# Patient Record
Sex: Female | Born: 1990 | Race: Black or African American | Hispanic: No | Marital: Single | Smoking: Former smoker
Health system: Southern US, Community
[De-identification: ages and names within clinical notes are randomized; demographics above are authoritative.]

## PROBLEM LIST (undated history)

## (undated) DIAGNOSIS — I10 Essential (primary) hypertension: Secondary | ICD-10-CM

---
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
## 2009-03-08 ENCOUNTER — Ambulatory Visit: Payer: Self-pay | Admitting: Women's Health

## 2009-09-20 ENCOUNTER — Emergency Department (HOSPITAL_BASED_OUTPATIENT_CLINIC_OR_DEPARTMENT_OTHER): Admission: EM | Admit: 2009-09-20 | Discharge: 2009-09-20 | Payer: Self-pay | Admitting: Emergency Medicine

## 2010-05-19 ENCOUNTER — Ambulatory Visit: Payer: Self-pay | Admitting: Women's Health

## 2011-07-17 ENCOUNTER — Encounter: Payer: Self-pay | Admitting: Women's Health

## 2011-07-17 ENCOUNTER — Other Ambulatory Visit (HOSPITAL_COMMUNITY)
Admission: RE | Admit: 2011-07-17 | Discharge: 2011-07-17 | Disposition: A | Discharge status: Home / Self Care | Payer: 59 | Source: Ambulatory Visit | Attending: Obstetrics and Gynecology | Admitting: Obstetrics and Gynecology

## 2011-07-17 ENCOUNTER — Ambulatory Visit (INDEPENDENT_AMBULATORY_CARE_PROVIDER_SITE_OTHER): Payer: 59 | Admitting: Women's Health

## 2011-07-17 VITALS — BP 126/80 | Ht 70.25 in | Wt 267.0 lb

## 2011-07-17 DIAGNOSIS — Z113 Encounter for screening for infections with a predominantly sexual mode of transmission: Secondary | ICD-10-CM

## 2011-07-17 DIAGNOSIS — IMO0001 Reserved for inherently not codable concepts without codable children: Secondary | ICD-10-CM

## 2011-07-17 DIAGNOSIS — Z833 Family history of diabetes mellitus: Secondary | ICD-10-CM

## 2011-07-17 DIAGNOSIS — Z309 Encounter for contraceptive management, unspecified: Secondary | ICD-10-CM

## 2011-07-17 DIAGNOSIS — Z23 Encounter for immunization: Secondary | ICD-10-CM

## 2011-07-17 DIAGNOSIS — Z01419 Encounter for gynecological examination (general) (routine) without abnormal findings: Secondary | ICD-10-CM

## 2011-07-17 DIAGNOSIS — L089 Local infection of the skin and subcutaneous tissue, unspecified: Secondary | ICD-10-CM

## 2011-07-17 LAB — CBC WITH DIFFERENTIAL/PLATELET
Eosinophils Absolute: 0.2 10*3/uL (ref 0.0–0.7)
Eosinophils Relative: 2 % (ref 0–5)
HCT: 40.5 % (ref 36.0–46.0)
Hemoglobin: 13 g/dL (ref 12.0–15.0)
Lymphs Abs: 3.3 10*3/uL (ref 0.7–4.0)
MCH: 27.3 pg (ref 26.0–34.0)
MCHC: 32.1 g/dL (ref 30.0–36.0)
MCV: 84.9 fL (ref 78.0–100.0)
Monocytes Absolute: 0.6 10*3/uL (ref 0.1–1.0)
Monocytes Relative: 8 % (ref 3–12)
Neutrophils Relative %: 43 % (ref 43–77)
RBC: 4.77 MIL/uL (ref 3.87–5.11)

## 2011-07-17 LAB — GLUCOSE, RANDOM: Glucose, Bld: 78 mg/dL (ref 70–99)

## 2011-07-17 LAB — URINALYSIS W MICROSCOPIC + REFLEX CULTURE
Glucose, UA: NEGATIVE mg/dL
Ketones, ur: NEGATIVE mg/dL
Nitrite: NEGATIVE
Specific Gravity, Urine: 1.015 (ref 1.005–1.030)
pH: 7 (ref 5.0–8.0)

## 2011-07-17 MED ORDER — CLINDAMYCIN PHOSPHATE 1 % EX GEL: Freq: Two times a day (BID) | CUTANEOUS | Status: AC

## 2011-07-17 MED ORDER — NORGESTIMATE-ETH ESTRADIOL 0.25-35 MG-MCG PO TABS: 1.0000 | ORAL_TABLET | Freq: Every day | ORAL | Status: DC

## 2011-07-17 NOTE — Progress Notes (Signed)
Jodi Castillo 10/18/1990 [id number]    History:    The patient presents for annual exam.  Monthly 5 days cycle on Sprintec. History of irregular cycles prior to Sprintec. New partner. 1 gardasil in December 2011. Complaint of boils on axilla, groin and inner thigh area.   Past medical history, past surgical history, family history and social history were all reviewed and documented in the EPIC chart.   ROS:  A  ROS was performed and pertinent positives and negatives are included in the history.  Exam:  Filed Vitals:   07/17/11 1136  BP: 126/80    General appearance:  Normal Head/Neck:  Normal, without cervical or supraclavicular adenopathy. Thyroid:  Symmetrical, normal in size, without palpable masses or nodularity. Respiratory  Effort:  Normal  Auscultation:  Clear without wheezing or rhonchi Cardiovascular  Auscultation:  Regular rate, without rubs, murmurs or gallops  Edema/varicosities:  Not grossly evident Abdominal  Soft,nontender, without masses, guarding or rebound.  Liver/spleen:  No organomegaly noted  Hernia:  None appreciated  Skin  Inspection:  Grossly normal  Palpation:  Grossly normal Neurologic/psychiatric  Orientation:  Normal with appropriate conversation.  Mood/affect:  Normal  Genitourinary    Breasts: Examined lying and sitting/pendulous.     Right: Without masses, retractions, discharge or axillary adenopathy.     Left: Without masses, retractions, discharge or axillary adenopathy.   Inguinal/mons:  Normal without inguinal adenopathy  External genitalia:  Small healing follicular infections  BUS/Urethra/Skene's glands:  Normal  Bladder:  Normal  Vagina:  Normal  Cervix:  Normal  Uterus:   normal in size, shape and contour.  Midline and mobile  Adnexa/parametria:     Rt: Without masses or tenderness.   Lt: Without masses or tenderness.  Anus and perineum: Normal  Digital rectal exam: Normal sphincter tone without palpated masses or  tenderness  Assessment/Plan:  21 y.o. SBFG0 for annual exam.   STD screening Morbid obesity Recurrent mild folliculitis Sprintec with regular cycle  Plan: Sprintec prescription, proper use, slight risk for blood clots and strokes reviewed. Encouraged condoms until permanent partner. SBE's, increasing exercise, decreasing calories for weight loss. Discussed importance of weight loss in relationship to health. Clindamycin gel to follicular irritations, prevention discussed call if no relief. CBC, glucose, UA and Pap,GC/Chlamydia, HIV, hepatitis C, and  RPR. Restart gardasil series today reviewed importance of returning to the office 2 and 6 months to complete the series.  Harrington Challenger [facility], 12:13 PM 07/17/2011

## 2011-07-17 NOTE — Patient Instructions (Signed)
Return to office for gardasil in 2 and 6 months  You can do it!!!!  Weight watchers for a healthier YOU!  Walk most days of the week for atleast 20-30 min

## 2011-07-18 LAB — HEPATITIS C ANTIBODY: HCV Ab: NEGATIVE

## 2011-07-18 LAB — HIV ANTIBODY (ROUTINE TESTING W REFLEX): HIV: NONREACTIVE

## 2011-07-18 LAB — GC/CHLAMYDIA PROBE AMP, GENITAL: GC Probe Amp, Genital: NEGATIVE

## 2011-07-19 ENCOUNTER — Other Ambulatory Visit: Payer: Self-pay | Admitting: Women's Health

## 2011-07-19 DIAGNOSIS — B999 Unspecified infectious disease: Secondary | ICD-10-CM

## 2011-07-19 MED ORDER — AZITHROMYCIN 1 G PO PACK: 1.0000 | PACK | Freq: Once | ORAL | Status: AC

## 2011-08-17 ENCOUNTER — Other Ambulatory Visit: Payer: Self-pay | Admitting: Women's Health

## 2011-08-17 DIAGNOSIS — IMO0001 Reserved for inherently not codable concepts without codable children: Secondary | ICD-10-CM

## 2011-08-17 MED ORDER — NORGESTIMATE-ETH ESTRADIOL 0.25-35 MG-MCG PO TABS: 1.0000 | ORAL_TABLET | Freq: Every day | ORAL | Status: DC

## 2011-09-27 ENCOUNTER — Ambulatory Visit (INDEPENDENT_AMBULATORY_CARE_PROVIDER_SITE_OTHER): Payer: 59 | Admitting: Anesthesiology

## 2011-09-27 DIAGNOSIS — Z23 Encounter for immunization: Secondary | ICD-10-CM

## 2011-12-14 ENCOUNTER — Encounter: Payer: Self-pay | Admitting: *Deleted

## 2011-12-14 NOTE — Progress Notes (Signed)
Patient ID: Jodi Castillo, female   DOB: [birth year], 21 y.o.   MRN: [id number] Reported + Chlamydia to HD

## 2011-12-14 NOTE — Telephone Encounter (Signed)
Error

## 2012-09-12 ENCOUNTER — Other Ambulatory Visit: Payer: Self-pay | Admitting: Women's Health

## 2012-09-12 NOTE — Telephone Encounter (Signed)
Will ask appt desk to call her to schedule CE.

## 2012-09-27 ENCOUNTER — Encounter: Payer: Self-pay | Admitting: Women's Health

## 2012-09-27 ENCOUNTER — Ambulatory Visit (INDEPENDENT_AMBULATORY_CARE_PROVIDER_SITE_OTHER): Payer: 59 | Admitting: Women's Health

## 2012-09-27 VITALS — BP 130/74 | Ht 70.0 in | Wt 265.0 lb

## 2012-09-27 DIAGNOSIS — Z309 Encounter for contraceptive management, unspecified: Secondary | ICD-10-CM

## 2012-09-27 DIAGNOSIS — Z23 Encounter for immunization: Secondary | ICD-10-CM

## 2012-09-27 DIAGNOSIS — Z833 Family history of diabetes mellitus: Secondary | ICD-10-CM

## 2012-09-27 DIAGNOSIS — Z01419 Encounter for gynecological examination (general) (routine) without abnormal findings: Secondary | ICD-10-CM

## 2012-09-27 DIAGNOSIS — Z113 Encounter for screening for infections with a predominantly sexual mode of transmission: Secondary | ICD-10-CM

## 2012-09-27 DIAGNOSIS — IMO0001 Reserved for inherently not codable concepts without codable children: Secondary | ICD-10-CM

## 2012-09-27 LAB — CBC WITH DIFFERENTIAL/PLATELET
Basophils Relative: 0 % (ref 0–1)
Eosinophils Absolute: 0.2 10*3/uL (ref 0.0–0.7)
Eosinophils Relative: 2 % (ref 0–5)
Hemoglobin: 12.5 g/dL (ref 12.0–15.0)
MCH: 27.2 pg (ref 26.0–34.0)
MCHC: 33.8 g/dL (ref 30.0–36.0)
MCV: 80.6 fL (ref 78.0–100.0)
Monocytes Absolute: 0.8 10*3/uL (ref 0.1–1.0)
Monocytes Relative: 8 % (ref 3–12)
Neutrophils Relative %: 55 % (ref 43–77)

## 2012-09-27 MED ORDER — NORGESTIMATE-ETH ESTRADIOL 0.25-35 MG-MCG PO TABS: ORAL_TABLET | ORAL | Status: DC

## 2012-09-27 NOTE — Patient Instructions (Signed)

## 2012-09-27 NOTE — Addendum Note (Signed)
Addended by: Richardson Chiquito on: 09/27/2012 04:22 PM   Modules accepted: Orders

## 2012-09-27 NOTE — Progress Notes (Signed)
Jodi Castillo [birth year] [id number]    History:    The patient presents for annual exam with no complaints. Regular monthly cycle on Orhtho-cyclen  Chlamydia 10 months ago and has not been sexually active since. No test of cure. 2 gardasil given in 2013. Normal pap history (2013). Shoulder, upper back, and neck discomfort from pendulous breasts.   Past medical history, past surgical history, family history and social history were all reviewed and documented in the EPIC chart. Works full time at [employer] [place]. Lives with sister. Stomach ulcer (0ct 13), nausea improved with omeprazole. Mother, MGM, and maternal aunt- HTN.   ROS:  A  ROS was performed and pertinent positives and negatives are included in the history. .  Exam:  Filed Vitals:   09/27/12 1409  BP: 130/74    General appearance:  Normal Head/Neck:  Normal, without cervical or supraclavicular adenopathy. Thyroid:  Symmetrical, normal in size, without palpable masses or nodularity. Respiratory  Effort:  Normal  Auscultation:  Clear without wheezing or rhonchi Cardiovascular  Auscultation:  Regular rate, without rubs, murmurs or gallops  Edema/varicosities:  Not grossly evident Abdominal  Soft,nontender, without masses, guarding or rebound.  Liver/spleen:  No organomegaly noted  Hernia:  None appreciated  Skin  Inspection:  Grossly normal  Palpation:  Grossly normal Neurologic/psychiatric  Orientation:  Normal with appropriate conversation.  Mood/affect:  Normal  Genitourinary    Breasts: Examined lying and sittingpendulous.     Right: Without masses, retractions, discharge or axillary adenopathy.     Left: Without masses, retractions, discharge or axillary adenopathy.   Inguinal/mons:  Normal without inguinal adenopathy  External genitalia:  Normal  BUS/Urethra/Skene's glands:  Normal  Bladder:  Normal  Vagina:  Normal  Cervix:  Normal  Uterus:  normal in size, shape and contour.  Midline and  mobile  Adnexa/parametria:     Rt: Without masses or tenderness.   Lt: Without masses or tenderness.  Anus and perineum: Normal    Assessment/Plan:  22 y.o. SBF G0  for annual exam with no complaints.  Normal GYN exam Obesity   Plan: CBC, glucose, UA, GC/Chlamydia - TOC.  3rd gardasil given. Ortho-cyclen 0.25-0.35 mg. Prescription, proper use, risk for blood clots and strokes reviewed, condoms encouraged when sexually active. SBE's, calcium rich diet, multivitamin. Encouraged healthy diet and exercise for weight loss and health.   Harrington Challenger [facility], 2:40 PM 09/27/2012

## 2012-09-28 LAB — GC/CHLAMYDIA PROBE AMP: CT Probe RNA: NEGATIVE

## 2012-09-28 LAB — URINALYSIS W MICROSCOPIC + REFLEX CULTURE
Bacteria, UA: NONE SEEN
Bilirubin Urine: NEGATIVE
Casts: NONE SEEN
Crystals: NONE SEEN
Glucose, UA: NEGATIVE mg/dL
Hgb urine dipstick: NEGATIVE
Ketones, ur: NEGATIVE mg/dL
Specific Gravity, Urine: 1.024 (ref 1.005–1.030)
pH: 6.5 (ref 5.0–8.0)

## 2012-11-15 ENCOUNTER — Ambulatory Visit (INDEPENDENT_AMBULATORY_CARE_PROVIDER_SITE_OTHER): Payer: 59 | Admitting: Women's Health

## 2012-11-15 ENCOUNTER — Encounter: Payer: Self-pay | Admitting: Women's Health

## 2012-11-15 DIAGNOSIS — B9689 Other specified bacterial agents as the cause of diseases classified elsewhere: Secondary | ICD-10-CM

## 2012-11-15 DIAGNOSIS — Z309 Encounter for contraceptive management, unspecified: Secondary | ICD-10-CM

## 2012-11-15 DIAGNOSIS — L293 Anogenital pruritus, unspecified: Secondary | ICD-10-CM

## 2012-11-15 DIAGNOSIS — N898 Other specified noninflammatory disorders of vagina: Secondary | ICD-10-CM

## 2012-11-15 DIAGNOSIS — IMO0001 Reserved for inherently not codable concepts without codable children: Secondary | ICD-10-CM

## 2012-11-15 DIAGNOSIS — A499 Bacterial infection, unspecified: Secondary | ICD-10-CM

## 2012-11-15 DIAGNOSIS — N760 Acute vaginitis: Secondary | ICD-10-CM

## 2012-11-15 LAB — WET PREP FOR TRICH, YEAST, CLUE: Yeast Wet Prep HPF POC: NONE SEEN

## 2012-11-15 MED ORDER — FLUCONAZOLE 150 MG PO TABS: 150.0000 mg | ORAL_TABLET | Freq: Once | ORAL | Status: DC

## 2012-11-15 MED ORDER — METRONIDAZOLE 0.75 % VA GEL: VAGINAL | Status: DC

## 2012-11-15 MED ORDER — NORGESTIMATE-ETH ESTRADIOL 0.25-35 MG-MCG PO TABS: ORAL_TABLET | ORAL | Status: DC

## 2012-11-15 NOTE — Patient Instructions (Addendum)
Bacterial Vaginosis Bacterial vaginosis (BV) is a vaginal infection where the normal balance of bacteria in the vagina is disrupted. The normal balance is then replaced by an overgrowth of certain bacteria. There are several different kinds of bacteria that can cause BV. BV is the most common vaginal infection in women of childbearing age. CAUSES   The cause of BV is not fully understood. BV develops when there is an increase or imbalance of harmful bacteria.  Some activities or behaviors can upset the normal balance of bacteria in the vagina and put women at increased risk including:  Having a new sex partner or multiple sex partners.  Douching.  Using an intrauterine device (IUD) for contraception.  It is not clear what role sexual activity plays in the development of BV. However, women that have never had sexual intercourse are rarely infected with BV. Women do not get BV from toilet seats, bedding, swimming pools or from touching objects around them.  SYMPTOMS   Grey vaginal discharge.  A fish-like odor with discharge, especially after sexual intercourse.  Itching or burning of the vagina and vulva.  Burning or pain with urination.  Some women have no signs or symptoms at all. DIAGNOSIS  Your caregiver must examine the vagina for signs of BV. Your caregiver will perform lab tests and look at the sample of vaginal fluid through a microscope. They will look for bacteria and abnormal cells (clue cells), a pH test higher than 4.5, and a positive amine test all associated with BV.  RISKS AND COMPLICATIONS   Pelvic inflammatory disease (PID).  Infections following gynecology surgery.  Developing HIV.  Developing herpes virus. TREATMENT  Sometimes BV will clear up without treatment. However, all women with symptoms of BV should be treated to avoid complications, especially if gynecology surgery is planned. Female partners generally do not need to be treated. However, BV may spread  between female sex partners so treatment is helpful in preventing a recurrence of BV.   BV may be treated with antibiotics. The antibiotics come in either pill or vaginal cream forms. Either can be used with nonpregnant or pregnant women, but the recommended dosages differ. These antibiotics are not harmful to the baby.  BV can recur after treatment. If this happens, a second round of antibiotics will often be prescribed.  Treatment is important for pregnant women. If not treated, BV can cause a premature delivery, especially for a pregnant woman who had a premature birth in the past. All pregnant women who have symptoms of BV should be checked and treated.  For chronic reoccurrence of BV, treatment with a type of prescribed gel vaginally twice a week is helpful. HOME CARE INSTRUCTIONS   Finish all medication as directed by your caregiver.  Do not have sex until treatment is completed.  Tell your sexual partner that you have a vaginal infection. They should see their caregiver and be treated if they have problems, such as a mild rash or itching.  Practice safe sex. Use condoms. Only have 1 sex partner. PREVENTION  Basic prevention steps can help reduce the risk of upsetting the natural balance of bacteria in the vagina and developing BV:  Do not have sexual intercourse (be abstinent).  Do not douche.  Use all of the medicine prescribed for treatment of BV, even if the signs and symptoms go away.  Tell your sex partner if you have BV. That way, they can be treated, if needed, to prevent reoccurrence. SEEK MEDICAL CARE IF:     Your symptoms are not improving after 3 days of treatment.  You have increased discharge, pain, or fever. MAKE SURE YOU:   Understand these instructions.  Will watch your condition.  Will get help right away if you are not doing well or get worse. FOR MORE INFORMATION  Division of STD Prevention (DSTDP), Centers for Disease Control and Prevention:  www.cdc.gov/std American Social Health Association (ASHA): www.ashastd.org  Document Released: 05/15/2005 Document Revised: 08/07/2011 Document Reviewed: 11/05/2008 ExitCare Patient Information 2014 [employer].  

## 2012-11-15 NOTE — Progress Notes (Signed)
Patient ID: Jodi Castillo, female   DOB: March 24, 1991, 22 y.o.   MRN: [id number] Presents with complaint of vaginal discharge with itching. Monthly cycle on Ortho-Cyclen. Intercourse x1 with new partner condom broke. Denies any urinary symptoms, abdominal pain or fever.  Exam: Appears well. External genitalia erythematous at introitus, speculum exam moderate amount of milky discharge noted, wet prep positive for amines, clues, TNTC bacteria. GC/Chlamydia culture taken. Bimanual no CMT limited exam due to obesity.  Bacteria vaginosis  Plan: MetroGel vaginal cream 1 applicator at bedtime x5, alcohol precautions reviewed. Diflucan 150 times one dose if needed for or itching. GC/Chlamydia culture pending, will check HIV, hepatitis and RPR at annual exam.

## 2013-10-06 ENCOUNTER — Other Ambulatory Visit: Payer: Self-pay

## 2013-10-06 DIAGNOSIS — IMO0001 Reserved for inherently not codable concepts without codable children: Secondary | ICD-10-CM

## 2013-10-06 MED ORDER — NORGESTIMATE-ETH ESTRADIOL 0.25-35 MG-MCG PO TABS: ORAL_TABLET | ORAL | Status: DC

## 2013-10-06 NOTE — Telephone Encounter (Signed)
Patient states she is needing to schedule CE but needs OC refilled.  She has started new job and cannot come right away. Only just now due.  I refilled OC's for the three packs she gets at a time. She will talk with her manager and she understands she needs CE before these three packs run out and she promised me she would get scheduled soon.

## 2013-10-23 ENCOUNTER — Encounter: Payer: Self-pay | Admitting: Women's Health

## 2013-10-23 ENCOUNTER — Ambulatory Visit (INDEPENDENT_AMBULATORY_CARE_PROVIDER_SITE_OTHER): Payer: BC Managed Care – PPO | Admitting: Women's Health

## 2013-10-23 VITALS — BP 128/84 | Ht 70.0 in | Wt 273.0 lb

## 2013-10-23 DIAGNOSIS — Z01419 Encounter for gynecological examination (general) (routine) without abnormal findings: Secondary | ICD-10-CM

## 2013-10-23 DIAGNOSIS — N760 Acute vaginitis: Secondary | ICD-10-CM

## 2013-10-23 DIAGNOSIS — Z309 Encounter for contraceptive management, unspecified: Secondary | ICD-10-CM

## 2013-10-23 DIAGNOSIS — A499 Bacterial infection, unspecified: Secondary | ICD-10-CM

## 2013-10-23 DIAGNOSIS — Z113 Encounter for screening for infections with a predominantly sexual mode of transmission: Secondary | ICD-10-CM

## 2013-10-23 DIAGNOSIS — N898 Other specified noninflammatory disorders of vagina: Secondary | ICD-10-CM

## 2013-10-23 DIAGNOSIS — B9689 Other specified bacterial agents as the cause of diseases classified elsewhere: Secondary | ICD-10-CM

## 2013-10-23 DIAGNOSIS — IMO0001 Reserved for inherently not codable concepts without codable children: Secondary | ICD-10-CM

## 2013-10-23 DIAGNOSIS — Z833 Family history of diabetes mellitus: Secondary | ICD-10-CM

## 2013-10-23 LAB — CBC WITH DIFFERENTIAL/PLATELET
BASOS PCT: 0 % (ref 0–1)
Basophils Absolute: 0 10*3/uL (ref 0.0–0.1)
EOS PCT: 1 % (ref 0–5)
Eosinophils Absolute: 0.1 10*3/uL (ref 0.0–0.7)
HEMATOCRIT: 37.7 % (ref 36.0–46.0)
HEMOGLOBIN: 12.6 g/dL (ref 12.0–15.0)
LYMPHS PCT: 35 % (ref 12–46)
Lymphs Abs: 3.3 10*3/uL (ref 0.7–4.0)
MCH: 26.5 pg (ref 26.0–34.0)
MCHC: 33.4 g/dL (ref 30.0–36.0)
MCV: 79.4 fL (ref 78.0–100.0)
MONO ABS: 0.6 10*3/uL (ref 0.1–1.0)
MONOS PCT: 6 % (ref 3–12)
NEUTROS ABS: 5.5 10*3/uL (ref 1.7–7.7)
Neutrophils Relative %: 58 % (ref 43–77)
Platelets: 403 10*3/uL — ABNORMAL HIGH (ref 150–400)
RBC: 4.75 MIL/uL (ref 3.87–5.11)
RDW: 13.7 % (ref 11.5–15.5)
WBC: 9.4 10*3/uL (ref 4.0–10.5)

## 2013-10-23 LAB — WET PREP FOR TRICH, YEAST, CLUE
Trich, Wet Prep: NONE SEEN
YEAST WET PREP: NONE SEEN

## 2013-10-23 LAB — GLUCOSE, RANDOM: Glucose, Bld: 83 mg/dL (ref 70–99)

## 2013-10-23 MED ORDER — NORGESTIMATE-ETH ESTRADIOL 0.25-35 MG-MCG PO TABS: ORAL_TABLET | ORAL | Status: DC

## 2013-10-23 MED ORDER — METRONIDAZOLE 0.75 % VA GEL: VAGINAL | Status: DC

## 2013-10-23 NOTE — Patient Instructions (Signed)

## 2013-10-23 NOTE — Progress Notes (Signed)
Jodi Castillo [birth year] [id number]    History:    Presents for annual exam.  Light monthly cycle on Ortho-Cyclen. New partner. Gardasil series completed. Normal Pap history. Complaint of vaginal irritation and upper back and shoulder pain from pendulous breasts. Positive Chlamydia in 2013 with negative test of cure  Past medical history, past surgical history, family history and social history were all reviewed and documented in the EPIC chart. Works at [employer]. Mother had a stroke this year.  ROS:  A  12 point ROS was performed and pertinent positives and negatives are included.  Exam:  Filed Vitals:   10/23/13 1402  BP: 128/84    General appearance:  Normal Thyroid:  Symmetrical, normal in size, without palpable masses or nodularity. Respiratory  Auscultation:  Clear without wheezing or rhonchi Cardiovascular  Auscultation:  Regular rate, without rubs, murmurs or gallops  Edema/varicosities:  Not grossly evident Abdominal  Soft,nontender, without masses, guarding or rebound.  Liver/spleen:  No organomegaly noted  Hernia:  None appreciated  Skin  Inspection:  Grossly normal   Breasts: Examined lying and sitting/pendulous.     Right: Without masses, retractions, discharge or axillary adenopathy.     Left: Without masses, retractions, discharge or axillary adenopathy. Gentitourinary   Inguinal/mons:  Normal without inguinal adenopathy  External genitalia:  Normal  BUS/Urethra/Skene's glands:  Normal  Vagina:  Wet prep positive for amines, clues, and TNTC bacteria  Cervix:  Normal  Uterus:   normal in size, shape and contour.  Midline and mobile  Adnexa/parametria:     Rt: Without masses or tenderness.   Lt: Without masses or tenderness.  Anus and perineum: Normal  Digital rectal exam: Normal sphincter tone without palpated masses or tenderness  Assessment/Plan:  23 y.o. SBF G0 for annual exam with vaginal irritation.  Bacteria vaginosis STD  screen Monthly cycle on Ortho-Cyclen Morbid obesity Pendulous breasts  Plan: Ortho-Cyclen prescription, proper use, slight risk for blood clots and strokes reviewed. Condoms encouraged until permanent partner. Reviewed importance of no smoking, decreasing calories for weight loss, increasing exercise, calcium rich diet, MVI daily and SBE's  encouraged. MetroGel vaginal cream 1 applicator at bedtime x5, prescription, proper use given alcohol precautions reviewed.  CBC, glucose, UA, GC/Chlamydia, HIV, hep B, C., RPR. Paps normal 2013, new screening guidelines reviewed. Reviewed scheduling an appointment with plastic surgeon to discuss breast reduction surgery.   Note: This dictation was prepared with Dragon/digital dictation.  Any transcriptional errors that result are unintentional. Harrington Challenger [facility], 3:46 PM 10/23/2013

## 2013-10-24 LAB — URINALYSIS W MICROSCOPIC + REFLEX CULTURE
BACTERIA UA: NONE SEEN
Bilirubin Urine: NEGATIVE
CASTS: NONE SEEN
Glucose, UA: NEGATIVE mg/dL
HGB URINE DIPSTICK: NEGATIVE
Leukocytes, UA: NEGATIVE
NITRITE: NEGATIVE
PROTEIN: NEGATIVE mg/dL
Specific Gravity, Urine: 1.026 (ref 1.005–1.030)
Squamous Epithelial / LPF: NONE SEEN
Urobilinogen, UA: 1 mg/dL (ref 0.0–1.0)
pH: 6 (ref 5.0–8.0)

## 2013-10-24 LAB — GC/CHLAMYDIA PROBE AMP
CT PROBE, AMP APTIMA: NEGATIVE
GC Probe RNA: NEGATIVE

## 2013-10-24 LAB — RPR

## 2013-10-24 LAB — HEPATITIS B SURFACE ANTIGEN: HEP B S AG: NEGATIVE

## 2013-10-24 LAB — HEPATITIS C ANTIBODY: HCV Ab: NEGATIVE

## 2013-10-24 LAB — HIV ANTIBODY (ROUTINE TESTING W REFLEX): HIV 1&2 Ab, 4th Generation: NONREACTIVE

## 2013-10-29 ENCOUNTER — Telehealth: Payer: Self-pay | Admitting: *Deleted

## 2013-10-29 MED ORDER — FLUCONAZOLE 150 MG PO TABS: ORAL_TABLET | ORAL | Status: DC

## 2013-10-29 NOTE — Telephone Encounter (Signed)
Pt was prescribed metrogel on 10/23/13 was also taking antibiotic for her mouth. Pt now has yeast infection requesting Rx for yeast. Please advise

## 2013-10-29 NOTE — Telephone Encounter (Signed)
Pt informed, rx sent 

## 2013-10-29 NOTE — Telephone Encounter (Signed)
Diflucan 150 today and repeat in 3 days if needed. #2

## 2013-12-12 ENCOUNTER — Telehealth: Payer: Self-pay | Admitting: *Deleted

## 2013-12-12 MED ORDER — FLUCONAZOLE 150 MG PO TABS: 150.0000 mg | ORAL_TABLET | Freq: Once | ORAL | Status: DC

## 2013-12-12 NOTE — Telephone Encounter (Signed)
Pt informed, rx sent 

## 2013-12-12 NOTE — Telephone Encounter (Signed)
Pt called c/o clumpy white discharge, took both Diflucan 150 #2 given on 10/29/13 telephone encounter. Pt said no itching, no odor, no problems, only the white discharge. She stopped taking the amoxacillin 1 week ago (was taking due dental work). Pt at work now [employer] to come for OV. Pt would like you recommendations. Please advise

## 2013-12-12 NOTE — Telephone Encounter (Signed)
Please call - Repeat diflucan 150 one more dose, should decrease the discharge. Probably antibiotic related.

## 2013-12-20 ENCOUNTER — Other Ambulatory Visit: Payer: Self-pay | Admitting: Women's Health

## 2014-01-01 ENCOUNTER — Telehealth: Payer: Self-pay | Admitting: *Deleted

## 2014-01-01 NOTE — Telephone Encounter (Signed)
Pt currently taking ortho cyclen having cylces monthly,takes on time daily. Pt said that her cycle came 1 week earlier on 1st pack has been bleeding 18 days now. Pt said bleeding would go from heavy to light, now heavy wearing tampons, changing every 3-4 hours. Please advise

## 2014-01-01 NOTE — Telephone Encounter (Signed)
Telephone call, states has had no missed pills, taking daily, same partner with negative screen. Double up today and tomorrow, if bleeding continues instructed to call. Instructed to take only 4 days placebo and start new pack of pills.

## 2014-01-01 NOTE — Telephone Encounter (Signed)
Patient called back to see if Harriett Sine "has come to a conclusion why I am having my menstrual cycle for 18 days."

## 2014-01-06 ENCOUNTER — Telehealth: Payer: Self-pay | Admitting: *Deleted

## 2014-01-06 NOTE — Telephone Encounter (Signed)
Telephone call to review problem. States has been bleeding for one month, minimal relief with doubling up pills, and shorter placebo week. Same partner neg STD screen. Office visit to check TSH and prolactin, if normal will proceed to sonohysterogram.

## 2014-01-06 NOTE — Telephone Encounter (Signed)
Pt called to follow up from telephone encounter 01/01/14 pt said she is still bleeding and done everything as directed as stating on that call. Please advise

## 2014-01-08 ENCOUNTER — Ambulatory Visit (INDEPENDENT_AMBULATORY_CARE_PROVIDER_SITE_OTHER): Payer: BC Managed Care – PPO | Admitting: Women's Health

## 2014-01-08 ENCOUNTER — Encounter: Payer: Self-pay | Admitting: Women's Health

## 2014-01-08 DIAGNOSIS — N925 Other specified irregular menstruation: Secondary | ICD-10-CM

## 2014-01-08 DIAGNOSIS — B9689 Other specified bacterial agents as the cause of diseases classified elsewhere: Secondary | ICD-10-CM

## 2014-01-08 DIAGNOSIS — A499 Bacterial infection, unspecified: Secondary | ICD-10-CM

## 2014-01-08 DIAGNOSIS — N949 Unspecified condition associated with female genital organs and menstrual cycle: Secondary | ICD-10-CM

## 2014-01-08 DIAGNOSIS — N760 Acute vaginitis: Secondary | ICD-10-CM

## 2014-01-08 DIAGNOSIS — N938 Other specified abnormal uterine and vaginal bleeding: Secondary | ICD-10-CM

## 2014-01-08 LAB — WET PREP FOR TRICH, YEAST, CLUE
Trich, Wet Prep: NONE SEEN
WBC, Wet Prep HPF POC: NONE SEEN
Yeast Wet Prep HPF POC: NONE SEEN

## 2014-01-08 LAB — TSH: TSH: 0.903 u[IU]/mL (ref 0.350–4.500)

## 2014-01-08 MED ORDER — METRONIDAZOLE 500 MG PO TABS: 500.0000 mg | ORAL_TABLET | Freq: Two times a day (BID) | ORAL | Status: DC

## 2014-01-08 MED ORDER — MEDROXYPROGESTERONE ACETATE 10 MG PO TABS: 10.0000 mg | ORAL_TABLET | Freq: Every day | ORAL | Status: DC

## 2014-01-08 NOTE — Patient Instructions (Signed)
Dysfunctional Uterine Bleeding Normally, menstrual periods begin between ages 11 to 17 in young women. A normal menstrual cycle/period may begin every 23 days up to 35 days and lasts from 1 to 7 days. Around 12 to 14 days before your menstrual period starts, ovulation (ovary produces an egg) occurs. When counting the time between menstrual periods, count from the first day of bleeding of the previous period to the first day of bleeding of the next period. Dysfunctional (abnormal) uterine bleeding is bleeding that is different from a normal menstrual period. Your periods may come earlier or later than usual. They may be lighter, have blood clots or be heavier. You may have bleeding between periods, or you may skip one period or more. You may have bleeding after sexual intercourse, bleeding after menopause, or no menstrual period. CAUSES   Pregnancy (normal, miscarriage, tubal).  IUDs (intrauterine device, birth control).  Birth control pills.  Hormone treatment.  Menopause.  Infection of the cervix.  Blood clotting problems.  Infection of the inside lining of the uterus.  Endometriosis, inside lining of the uterus growing in the pelvis and other female organs.  Adhesions (scar tissue) inside the uterus.  Obesity or severe weight loss.  Uterine polyps inside the uterus.  Cancer of the vagina, cervix, or uterus.  Ovarian cysts or polycystic ovary syndrome.  Medical problems (diabetes, thyroid disease).  Uterine fibroids (noncancerous tumor).  Problems with your female hormones.  Endometrial hyperplasia, very thick lining and enlarged cells inside of the uterus.  Medicines that interfere with ovulation.  Radiation to the pelvis or abdomen.  Chemotherapy. DIAGNOSIS   Your doctor will discuss the history of your menstrual periods, medicines you are taking, changes in your weight, stress in your life, and any medical problems you may have.  Your doctor will do a physical  and pelvic examination.  Your doctor may want to perform certain tests to make a diagnosis, such as:  Pap test.  Blood tests.  Cultures for infection.  CT scan.  Ultrasound.  Hysteroscopy.  Laparoscopy.  MRI.  Hysterosalpingography.  D and C.  Endometrial biopsy. TREATMENT  Treatment will depend on the cause of the dysfunctional uterine bleeding (DUB). Treatment may include:  Observing your menstrual periods for a couple of months.  Prescribing medicines for medical problems, including:  Antibiotics.  Hormones.  Birth control pills.  Removing an IUD (intrauterine device, birth control).  Surgery:  D and C (scrape and remove tissue from inside the uterus).  Laparoscopy (examine inside the abdomen with a lighted tube).  Uterine ablation (destroy lining of the uterus with electrical current, laser, heat, or freezing).  Hysteroscopy (examine cervix and uterus with a lighted tube).  Hysterectomy (remove the uterus). HOME CARE INSTRUCTIONS   If medicines were prescribed, take exactly as directed. Do not change or switch medicines without consulting your caregiver.  Long term heavy bleeding may result in iron deficiency. Your caregiver may have prescribed iron pills. They help replace the iron that your body lost from heavy bleeding. Take exactly as directed.  Do not take aspirin or medicines that contain aspirin one week before or during your menstrual period. Aspirin may make the bleeding worse.  If you need to change your sanitary pad or tampon more than once every 2 hours, stay in bed with your feet elevated and a cold pack on your lower abdomen. Rest as much as possible, until the bleeding stops or slows down.  Eat well-balanced meals. Eat foods high in iron. Examples   are:  Leafy green vegetables.  Whole-grain breads and cereals.  Eggs.  Meat.  Liver.  Do not try to lose weight until the abnormal bleeding has stopped and your blood iron level is  back to normal. Do not lift more than ten pounds or do strenuous activities when you are bleeding.  For a couple of months, make note on your calendar, marking the start and ending of your period, and the type of bleeding (light, medium, heavy, spotting, clots or missed periods). This is for your caregiver to better evaluate your problem. SEEK MEDICAL CARE IF:   You develop nausea (feeling sick to your stomach) and vomiting, dizziness, or diarrhea while you are taking your medicine.  You are getting lightheaded or weak.  You have any problems that may be related to the medicine you are taking.  You develop pain with your DUB.  You want to remove your IUD.  You want to stop or change your birth control pills or hormones.  You have any type of abnormal bleeding mentioned above.  You are over 16 years old and have not had a menstrual period yet.  You are 23 years old and you are still having menstrual periods.  You have any of the symptoms mentioned above.  You develop a rash. SEEK IMMEDIATE MEDICAL CARE IF:   An oral temperature above 102 F (38.9 C) develops.  You develop chills.  You are changing your sanitary pad or tampon more than once an hour.  You develop abdominal pain.  You pass out or faint. Document Released: 05/12/2000 Document Revised: 08/07/2011 Document Reviewed: 04/13/2009 ExitCare Patient Information 2015 [employer]. This information is not intended to replace advice given to you by your health care provider. Make sure you discuss any questions you have with your health care provider.  

## 2014-01-08 NOTE — Progress Notes (Signed)
Patient ID: Jodi Castillo, female   DOB: 16-Jan-1991, 23 y.o.   MRN: [id number] Presents with complaint of irregular bleeding for past month, menses started 1 week prior to placebo week Ortho-Cyclen and has bled ever since. First time this has happened. New partner. Denies any missed pills, has doubled up on pills with no relief. Denies abdominal pain, urinary symptoms or fever.  Exam: Appears well, obese. External genitalia within normal limits, speculum exam moderate amount menses type blood noted, cervix without visible lesion/polyp , GC/Chlamydia culture taken, wet prep positive for amines, clues, and TNTC bacteria. Bimanual limited- obesity, nontender no CMT.   DUB Bacteria vaginosis STD screening  Plan: HCG qualitative, GC/Chlamydia, HIV, hep B, C., RPR, TSH. Provera 10 mg by mouth daily for 10 days,  start new pack Ortho-Cyclen with next cycle, reviewed importance of condoms until after first month on pills.  Instructed to call if bleeding does not stop. Schedule annual exam in October.Marland Kitchen

## 2014-01-09 LAB — GC/CHLAMYDIA PROBE AMP
CT PROBE, AMP APTIMA: NEGATIVE
GC Probe RNA: NEGATIVE

## 2014-01-09 LAB — HCG, QUANTITATIVE, PREGNANCY: hCG, Beta Chain, Quant, S: <2 m[IU]/mL

## 2014-01-09 LAB — HIV ANTIBODY (ROUTINE TESTING W REFLEX): HIV 1&2 Ab, 4th Generation: NONREACTIVE

## 2014-01-09 LAB — HEPATITIS B SURFACE ANTIGEN: HEP B S AG: NEGATIVE

## 2014-01-09 LAB — HEPATITIS C ANTIBODY: HCV AB: NEGATIVE

## 2014-01-09 LAB — RPR

## 2014-01-13 ENCOUNTER — Telehealth: Payer: Self-pay

## 2014-01-13 NOTE — Telephone Encounter (Signed)
Encounter already opened. 

## 2014-01-13 NOTE — Telephone Encounter (Signed)
Telephone call, double up on Provera today, instructed to continue until completed 10 days, call if bleeding persists.

## 2014-01-13 NOTE — Telephone Encounter (Signed)
Was in 8/13 with bleeding and was presribed Provera x 10 days to stop the bleeding. Patient states she has been taking it x5 days and is still bleeding heavily and wondered when it is going to help.

## 2014-01-15 ENCOUNTER — Telehealth: Payer: Self-pay

## 2014-01-15 ENCOUNTER — Other Ambulatory Visit: Payer: Self-pay | Admitting: Women's Health

## 2014-01-15 DIAGNOSIS — N921 Excessive and frequent menstruation with irregular cycle: Secondary | ICD-10-CM

## 2014-01-15 MED ORDER — MEGESTROL ACETATE 40 MG PO TABS: 40.0000 mg | ORAL_TABLET | Freq: Two times a day (BID) | ORAL | Status: DC

## 2014-01-15 NOTE — Telephone Encounter (Signed)
Telephone call to review problem, has been having irregular heavy bleeding for one month, negative STD screen, negative qualitative hCG, normal TSH and prolactin. Was given Provera 10 for 10 days, bleeding has persisted and is heavy. Megace 40 twice daily, schedule sonohysterogram with Dr. Audie Box, condoms if  sexually active. Rx E. scribed and switched to scheduling. Instructed to call if bleeding does not stop.

## 2014-01-15 NOTE — Telephone Encounter (Signed)
Patient is going out of town tomorrow and if needs a Rx would like to get it before she goes.  She doubled  Up on Provera x 2 days as you directed and finished the Rx.  Bleeding continues unchanged.

## 2014-01-21 ENCOUNTER — Other Ambulatory Visit: Payer: Self-pay | Admitting: Gynecology

## 2014-01-21 DIAGNOSIS — N938 Other specified abnormal uterine and vaginal bleeding: Secondary | ICD-10-CM

## 2014-02-06 ENCOUNTER — Ambulatory Visit: Payer: BC Managed Care – PPO | Admitting: Gynecology

## 2014-02-06 ENCOUNTER — Other Ambulatory Visit: Payer: BC Managed Care – PPO

## 2015-01-25 ENCOUNTER — Other Ambulatory Visit: Payer: Self-pay | Admitting: Women's Health

## 2015-02-12 ENCOUNTER — Encounter (HOSPITAL_BASED_OUTPATIENT_CLINIC_OR_DEPARTMENT_OTHER): Payer: Self-pay

## 2015-02-12 ENCOUNTER — Emergency Department (HOSPITAL_BASED_OUTPATIENT_CLINIC_OR_DEPARTMENT_OTHER)
Admission: EM | Admit: 2015-02-12 | Discharge: 2015-02-13 | Disposition: A | Discharge status: Home / Self Care | Payer: No Typology Code available for payment source | Attending: Emergency Medicine | Admitting: Emergency Medicine

## 2015-02-12 DIAGNOSIS — Z3202 Encounter for pregnancy test, result negative: Secondary | ICD-10-CM | POA: Diagnosis not present

## 2015-02-12 DIAGNOSIS — S299XXA Unspecified injury of thorax, initial encounter: Secondary | ICD-10-CM | POA: Insufficient documentation

## 2015-02-12 DIAGNOSIS — Z793 Long term (current) use of hormonal contraceptives: Secondary | ICD-10-CM | POA: Diagnosis not present

## 2015-02-12 DIAGNOSIS — Z79899 Other long term (current) drug therapy: Secondary | ICD-10-CM | POA: Insufficient documentation

## 2015-02-12 DIAGNOSIS — Y92410 Unspecified street and highway as the place of occurrence of the external cause: Secondary | ICD-10-CM | POA: Diagnosis not present

## 2015-02-12 DIAGNOSIS — Y9389 Activity, other specified: Secondary | ICD-10-CM | POA: Diagnosis not present

## 2015-02-12 DIAGNOSIS — Z87891 Personal history of nicotine dependence: Secondary | ICD-10-CM | POA: Insufficient documentation

## 2015-02-12 DIAGNOSIS — Z8742 Personal history of other diseases of the female genital tract: Secondary | ICD-10-CM | POA: Insufficient documentation

## 2015-02-12 DIAGNOSIS — Y998 Other external cause status: Secondary | ICD-10-CM | POA: Insufficient documentation

## 2015-02-12 DIAGNOSIS — S3991XA Unspecified injury of abdomen, initial encounter: Secondary | ICD-10-CM | POA: Insufficient documentation

## 2015-02-12 DIAGNOSIS — S99912A Unspecified injury of left ankle, initial encounter: Secondary | ICD-10-CM | POA: Insufficient documentation
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
## 2015-02-12 NOTE — ED Notes (Signed)
Patient ambulated to the restroom without difficulty.

## 2015-02-13 ENCOUNTER — Emergency Department (HOSPITAL_BASED_OUTPATIENT_CLINIC_OR_DEPARTMENT_OTHER): Payer: No Typology Code available for payment source

## 2015-02-13 DIAGNOSIS — S99912A Unspecified injury of left ankle, initial encounter: Secondary | ICD-10-CM | POA: Diagnosis not present

## 2015-02-13 LAB — PREGNANCY, URINE: PREG TEST UR: NEGATIVE

## 2015-02-13 MED ORDER — HYDROCODONE-ACETAMINOPHEN 5-325 MG PO TABS: 1.0000 | ORAL_TABLET | Freq: Four times a day (QID) | ORAL | Status: DC | PRN

## 2015-02-13 MED ORDER — HYDROCODONE-ACETAMINOPHEN 5-325 MG PO TABS
1.0000 | ORAL_TABLET | Freq: Once | ORAL | Status: AC
  Administered 2015-02-13: 1 via ORAL
  Filled 2015-02-13: qty 1

## 2015-02-13 NOTE — Discharge Instructions (Signed)
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
## 2015-03-12 ENCOUNTER — Other Ambulatory Visit: Payer: Self-pay | Admitting: Women's Health

## 2015-03-12 ENCOUNTER — Telehealth: Payer: Self-pay | Admitting: *Deleted

## 2015-03-12 MED ORDER — NORGESTIMATE-ETH ESTRADIOL 0.25-35 MG-MCG PO TABS: ORAL_TABLET | ORAL | Status: DC

## 2015-03-12 NOTE — Telephone Encounter (Signed)
What is the medication?, Can she schedule an annual sooner?

## 2015-03-12 NOTE — Telephone Encounter (Signed)
Sorry it is her birth control pills

## 2015-03-12 NOTE — Telephone Encounter (Signed)
Pt annual scheduled on 06/15/15, last annual in May 2014, was seen for problems visits in 2015. Okay to refill?
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
## 2015-06-09 ENCOUNTER — Encounter: Payer: Self-pay | Admitting: Women's Health

## 2015-06-24 ENCOUNTER — Encounter: Payer: Self-pay | Admitting: Women's Health

## 2015-06-24 ENCOUNTER — Ambulatory Visit (INDEPENDENT_AMBULATORY_CARE_PROVIDER_SITE_OTHER): Payer: BLUE CROSS/BLUE SHIELD | Admitting: Women's Health

## 2015-06-24 ENCOUNTER — Other Ambulatory Visit (HOSPITAL_COMMUNITY)
Admission: RE | Admit: 2015-06-24 | Discharge: 2015-06-24 | Disposition: A | Discharge status: Home / Self Care | Payer: BLUE CROSS/BLUE SHIELD | Source: Ambulatory Visit | Attending: Gynecology | Admitting: Gynecology

## 2015-06-24 VITALS — BP 124/80 | Ht 71.0 in | Wt 300.0 lb

## 2015-06-24 DIAGNOSIS — Z833 Family history of diabetes mellitus: Secondary | ICD-10-CM | POA: Diagnosis not present

## 2015-06-24 DIAGNOSIS — Z01419 Encounter for gynecological examination (general) (routine) without abnormal findings: Secondary | ICD-10-CM

## 2015-06-24 DIAGNOSIS — Z3041 Encounter for surveillance of contraceptive pills: Secondary | ICD-10-CM

## 2015-06-24 DIAGNOSIS — Z13220 Encounter for screening for lipoid disorders: Secondary | ICD-10-CM | POA: Diagnosis not present

## 2015-06-24 DIAGNOSIS — Z1329 Encounter for screening for other suspected endocrine disorder: Secondary | ICD-10-CM

## 2015-06-24 LAB — CBC WITH DIFFERENTIAL/PLATELET
Basophils Absolute: 0 10*3/uL (ref 0.0–0.1)
Basophils Relative: 0 % (ref 0–1)
EOS PCT: 2 % (ref 0–5)
Eosinophils Absolute: 0.2 10*3/uL (ref 0.0–0.7)
HEMATOCRIT: 41 % (ref 36.0–46.0)
HEMOGLOBIN: 13.4 g/dL (ref 12.0–15.0)
LYMPHS PCT: 39 % (ref 12–46)
Lymphs Abs: 3.4 10*3/uL (ref 0.7–4.0)
MCH: 27 pg (ref 26.0–34.0)
MCHC: 32.7 g/dL (ref 30.0–36.0)
MCV: 82.7 fL (ref 78.0–100.0)
MONO ABS: 0.7 10*3/uL (ref 0.1–1.0)
MONOS PCT: 8 % (ref 3–12)
MPV: 10.5 fL (ref 8.6–12.4)
NEUTROS ABS: 4.5 10*3/uL (ref 1.7–7.7)
Neutrophils Relative %: 51 % (ref 43–77)
Platelets: 366 10*3/uL (ref 150–400)
RBC: 4.96 MIL/uL (ref 3.87–5.11)
RDW: 13.7 % (ref 11.5–15.5)
WBC: 8.8 10*3/uL (ref 4.0–10.5)

## 2015-06-24 LAB — TSH: TSH: 1.536 u[IU]/mL (ref 0.350–4.500)

## 2015-06-24 LAB — LIPID PANEL
Cholesterol: 129 mg/dL (ref 125–200)
HDL: 26 mg/dL — ABNORMAL LOW (ref >=46)
LDL CALC: 75 mg/dL (ref <130)
Total CHOL/HDL Ratio: 5 Ratio (ref <=5.0)
Triglycerides: 140 mg/dL (ref <150)
VLDL: 28 mg/dL (ref <30)

## 2015-06-24 LAB — GLUCOSE, RANDOM: GLUCOSE: 80 mg/dL (ref 65–99)

## 2015-06-24 MED ORDER — NORGESTIMATE-ETH ESTRADIOL 0.25-35 MG-MCG PO TABS: 1.0000 | ORAL_TABLET | Freq: Every day | ORAL | Status: DC

## 2015-06-24 NOTE — Patient Instructions (Signed)
Basic Carbohydrate Counting for Diabetes Mellitus Carbohydrate counting is a method for keeping track of the amount of carbohydrates you eat. Eating carbohydrates naturally increases the level of sugar (glucose) in your blood, so it is important for you to know the amount that is okay for you to have in every meal. Carbohydrate counting helps keep the level of glucose in your blood within normal limits. The amount of carbohydrates allowed is different for every person. A dietitian can help you calculate the amount that is right for you. Once you know the amount of carbohydrates you can have, you can count the carbohydrates in the foods you want to eat. Carbohydrates are found in the following foods:  Grains, such as breads and cereals.  Dried beans and soy products.  Starchy vegetables, such as potatoes, peas, and corn.  Fruit and fruit juices.  Milk and yogurt.  Sweets and snack foods, such as cake, cookies, candy, chips, soft drinks, and fruit drinks. CARBOHYDRATE COUNTING There are two ways to count the carbohydrates in your food. You can use either of the methods or a combination of both. Reading the "Nutrition Facts" on [place] The "Nutrition Facts" is an area that is included on the labels of almost all packaged food and beverages in the [place]. It includes the serving size of that food or beverage and information about the nutrients in each serving of the food, including the grams (g) of carbohydrate per serving.  Decide the number of servings of this food or beverage that you will be able to eat or drink. Multiply that number of servings by the number of grams of carbohydrate that is listed on the label for that serving. The total will be the amount of carbohydrates you will be having when you eat or drink this food or beverage. Learning Standard Serving Sizes of Food When you eat food that is not packaged or does not include "Nutrition Facts" on the label, you need to  measure the servings in order to count the amount of carbohydrates.A serving of most carbohydrate-rich foods contains about 15 g of carbohydrates. The following list includes serving sizes of carbohydrate-rich foods that provide 15 g ofcarbohydrate per serving:   1 slice of bread (1 oz) or 1 six-inch tortilla.    of a hamburger bun or English muffin.  4-6 crackers.   cup unsweetened dry cereal.    cup hot cereal.   cup rice or pasta.    cup mashed potatoes or  of a large baked potato.  1 cup fresh fruit or one small piece of fruit.    cup canned or frozen fruit or fruit juice.  1 cup milk.   cup plain fat-free yogurt or yogurt sweetened with artificial sweeteners.   cup cooked dried beans or starchy vegetable, such as peas, corn, or potatoes.  Decide the number of standard-size servings that you will eat. Multiply that number of servings by 15 (the grams of carbohydrates in that serving). For example, if you eat 2 cups of strawberries, you will have eaten 2 servings and 30 g of carbohydrates (2 servings x 15 g = 30 g). For foods such as soups and casseroles, in which more than one food is mixed in, you will need to count the carbohydrates in each food that is included. EXAMPLE OF CARBOHYDRATE COUNTING Sample Dinner  3 oz chicken breast.   cup of brown rice.   cup of corn.  1 cup milk.   1 cup strawberries with  sugar-free whipped topping.  Carbohydrate Calculation Step 1: Identify the foods that contain carbohydrates:   Rice.   Corn.   Milk.   Strawberries. Step 2:Calculate the number of servings eaten of each:   2 servings of rice.   1 serving of corn.   1 serving of milk.   1 serving of strawberries. Step 3: Multiply each of those number of servings by 15 g:   2 servings of rice x 15 g = 30 g.   1 serving of corn x 15 g = 15 g.   1 serving of milk x 15 g = 15 g.   1 serving of strawberries x 15 g = 15 g. Step 4: Add  together all of the amounts to find the total grams of carbohydrates eaten: 30 g + 15 g + 15 g + 15 g = 75 g.   This information is not intended to replace advice given to you by your health care provider. Make sure you discuss any questions you have with your health care provider.   Document Released: 05/15/2005 Document Revised: 06/05/2014 Document Reviewed: 04/11/2013 Elsevier Interactive Patient Education 2016 Heath Maintenance, Female Adopting a healthy lifestyle and getting preventive care can go a long way to promote health and wellness. Talk with your health care provider about what schedule of regular examinations is right for you. This is a good chance for you to check in with your provider about disease prevention and staying healthy. In between checkups, there are plenty of things you can do on your own. Experts have done a lot of research about which lifestyle changes and preventive measures are most likely to keep you healthy. Ask your health care provider for more information. WEIGHT AND DIET  Eat a healthy diet  Be sure to include plenty of vegetables, fruits, low-fat dairy products, and lean protein.  Do not eat a lot of foods high in solid fats, added sugars, or salt.  Get regular exercise. This is one of the most important things you can do for your health.  Most adults should exercise for at least 150 minutes each week. The exercise should increase your heart rate and make you sweat (moderate-intensity exercise).  Most adults should also do strengthening exercises at least twice a week. This is in addition to the moderate-intensity exercise.  Maintain a healthy weight  Body mass index (BMI) is a measurement that can be used to identify possible weight problems. It estimates body fat based on height and weight. Your health care provider can help determine your BMI and help you achieve or maintain a healthy weight.  For females 87 years of age and older:    A BMI below 18.5 is considered underweight.  A BMI of 18.5 to 24.9 is normal.  A BMI of 25 to 29.9 is considered overweight.  A BMI of 30 and above is considered obese.  Watch levels of cholesterol and blood lipids  You should start having your blood tested for lipids and cholesterol at 26 years of age, then have this test every 5 years.  You may need to have your cholesterol levels checked more often if:  Your lipid or cholesterol levels are high.  You are older than 26 years of age.  You are at high risk for heart disease.  CANCER SCREENING   Lung Cancer  Lung cancer screening is recommended for adults 12-76 years old who are at high risk for lung cancer because of a history of  smoking.  A yearly low-dose CT scan of the lungs is recommended for people who:  Currently smoke.  Have quit within the past 15 years.  Have at least a 30-pack-year history of smoking. A pack year is smoking an average of one pack of cigarettes a day for 1 year.  Yearly screening should continue until it has been 15 years since you quit.  Yearly screening should stop if you develop a health problem that would prevent you from having lung cancer treatment.  Breast Cancer  Practice breast self-awareness. This means understanding how your breasts normally appear and feel.  It also means doing regular breast self-exams. Let your health care provider know about any changes, no matter how small.  If you are in your 20s or 30s, you should have a clinical breast exam (CBE) by a health care provider every 1-3 years as part of a regular health exam.  If you are 66 or older, have a CBE every year. Also consider having a breast X-ray (mammogram) every year.  If you have a family history of breast cancer, talk to your health care provider about genetic screening.  If you are at high risk for breast cancer, talk to your health care provider about having an MRI and a mammogram every year.  Breast  cancer gene (BRCA) assessment is recommended for women who have family members with BRCA-related cancers. BRCA-related cancers include:  Breast.  Ovarian.  Tubal.  Peritoneal cancers.  Results of the assessment will determine the need for genetic counseling and BRCA1 and BRCA2 testing. Cervical Cancer Your health care provider may recommend that you be screened regularly for cancer of the pelvic organs (ovaries, uterus, and vagina). This screening involves a pelvic examination, including checking for microscopic changes to the surface of your cervix (Pap test). You may be encouraged to have this screening done every 3 years, beginning at age 24.  For women ages 17-65, health care providers may recommend pelvic exams and Pap testing every 3 years, or they may recommend the Pap and pelvic exam, combined with testing for human papilloma virus (HPV), every 5 years. Some types of HPV increase your risk of cervical cancer. Testing for HPV may also be done on women of any age with unclear Pap test results.  Other health care providers may not recommend any screening for nonpregnant women who are considered low risk for pelvic cancer and who do not have symptoms. Ask your health care provider if a screening pelvic exam is right for you.  If you have had past treatment for cervical cancer or a condition that could lead to cancer, you need Pap tests and screening for cancer for at least 20 years after your treatment. If Pap tests have been discontinued, your risk factors (such as having a new sexual partner) need to be reassessed to determine if screening should resume. Some women have medical problems that increase the chance of getting cervical cancer. In these cases, your health care provider may recommend more frequent screening and Pap tests. Colorectal Cancer  This type of cancer can be detected and often prevented.  Routine colorectal cancer screening usually begins at 26 years of age and  continues through 26 years of age.  Your health care provider may recommend screening at an earlier age if you have risk factors for colon cancer.  Your health care provider may also recommend using home test kits to check for hidden blood in the stool.  A small camera at the end  of a tube can be used to examine your colon directly (sigmoidoscopy or colonoscopy). This is done to check for the earliest forms of colorectal cancer.  Routine screening usually begins at age 50.  Direct examination of the colon should be repeated every 5-10 years through 26 years of age. However, you may need to be screened more often if early forms of precancerous polyps or small growths are found. Skin Cancer  Check your skin from head to toe regularly.  Tell your health care provider about any new moles or changes in moles, especially if there is a change in a mole's shape or color.  Also tell your health care provider if you have a mole that is larger than the size of a pencil eraser.  Always use sunscreen. Apply sunscreen liberally and repeatedly throughout the day.  Protect yourself by wearing long sleeves, pants, a wide-brimmed hat, and sunglasses whenever you are outside. HEART DISEASE, DIABETES, AND HIGH BLOOD PRESSURE   High blood pressure causes heart disease and increases the risk of stroke. High blood pressure is more likely to develop in:  People who have blood pressure in the high end of the normal range (130-139/85-89 mm Hg).  People who are overweight or obese.  People who are African American.  If you are 18-39 years of age, have your blood pressure checked every 3-5 years. If you are 40 years of age or older, have your blood pressure checked every year. You should have your blood pressure measured twice--once when you are at a hospital or clinic, and once when you are not at a hospital or clinic. Record the average of the two measurements. To check your blood pressure when you are not at  a hospital or clinic, you can use:  An automated blood pressure machine at a pharmacy.  A home blood pressure monitor.  If you are between 55 years and 79 years old, ask your health care provider if you should take aspirin to prevent strokes.  Have regular diabetes screenings. This involves taking a blood sample to check your fasting blood sugar level.  If you are at a normal weight and have a low risk for diabetes, have this test once every three years after 26 years of age.  If you are overweight and have a high risk for diabetes, consider being tested at a younger age or more often. PREVENTING INFECTION  Hepatitis B  If you have a higher risk for hepatitis B, you should be screened for this virus. You are considered at high risk for hepatitis B if:  You were born in a country where hepatitis B is common. Ask your health care provider which countries are considered high risk.  Your parents were born in a high-risk country, and you have not been immunized against hepatitis B (hepatitis B vaccine).  You have HIV or AIDS.  You use needles to inject street drugs.  You live with someone who has hepatitis B.  You have had sex with someone who has hepatitis B.  You get hemodialysis treatment.  You take certain medicines for conditions, including cancer, organ transplantation, and autoimmune conditions. Hepatitis C  Blood testing is recommended for:  Everyone born from 1945 through 1965.  Anyone with known risk factors for hepatitis C. Sexually transmitted infections (STIs)  You should be screened for sexually transmitted infections (STIs) including gonorrhea and chlamydia if:  You are sexually active and are younger than 26 years of age.  You are older than 26   years of age and your health care provider tells you that you are at risk for this type of infection.  Your sexual activity has changed since you were last screened and you are at an increased risk for chlamydia or  gonorrhea. Ask your health care provider if you are at risk.  If you do not have HIV, but are at risk, it may be recommended that you take a prescription medicine daily to prevent HIV infection. This is called pre-exposure prophylaxis (PrEP). You are considered at risk if:  You are sexually active and do not regularly use condoms or know the HIV status of your partner(s).  You take drugs by injection.  You are sexually active with a partner who has HIV. Talk with your health care provider about whether you are at high risk of being infected with HIV. If you choose to begin PrEP, you should first be tested for HIV. You should then be tested every 3 months for as long as you are taking PrEP.  PREGNANCY   If you are premenopausal and you may become pregnant, ask your health care provider about preconception counseling.  If you may become pregnant, take 400 to 800 micrograms (mcg) of folic acid every day.  If you want to prevent pregnancy, talk to your health care provider about birth control (contraception). OSTEOPOROSIS AND MENOPAUSE   Osteoporosis is a disease in which the bones lose minerals and strength with aging. This can result in serious bone fractures. Your risk for osteoporosis can be identified using a bone density scan.  If you are 65 years of age or older, or if you are at risk for osteoporosis and fractures, ask your health care provider if you should be screened.  Ask your health care provider whether you should take a calcium or vitamin D supplement to lower your risk for osteoporosis.  Menopause may have certain physical symptoms and risks.  Hormone replacement therapy may reduce some of these symptoms and risks. Talk to your health care provider about whether hormone replacement therapy is right for you.  HOME CARE INSTRUCTIONS   Schedule regular health, dental, and eye exams.  Stay current with your immunizations.   Do not use any tobacco products including  cigarettes, chewing tobacco, or electronic cigarettes.  If you are pregnant, do not drink alcohol.  If you are breastfeeding, limit how much and how often you drink alcohol.  Limit alcohol intake to no more than 1 drink per day for nonpregnant women. One drink equals 12 ounces of beer, 5 ounces of wine, or 1 ounces of hard liquor.  Do not use street drugs.  Do not share needles.  Ask your health care provider for help if you need support or information about quitting drugs.  Tell your health care provider if you often feel depressed.  Tell your health care provider if you have ever been abused or do not feel safe at home.   This information is not intended to replace advice given to you by your health care provider. Make sure you discuss any questions you have with your health care provider.   Document Released: 11/28/2010 Document Revised: 06/05/2014 Document Reviewed: 04/16/2013 Elsevier Interactive Patient Education 2016 [employer]  

## 2015-06-24 NOTE — Progress Notes (Signed)
Jodi Castillo 1990/06/12 [id number]    History:    Presents for annual exam.  Regular cycles/ortho cylen.  Same partner for 2 years, denies need for repeat STD screen. Started [employer] program and joined a gym to lose weight.  Wants a referral for breast reduction due to shoulder, upper back, and neck discomfort from pendulous breasts. Gardasil series complete. Normal Pap history.  Past medical history, past surgical history, family history and social history were all reviewed and documented in the EPIC chart.  Mother moved to [place], now engaged. 2 sisters live locally. Working at [employer].  ROS:  A ROS was performed and pertinent positives and negatives are included.  Exam:  Filed Vitals:   06/24/15 0923  BP: 124/80    General appearance:  Normal Thyroid:  Symmetrical, normal in size, without palpable masses or nodularity. Respiratory  Auscultation:  Clear without wheezing or rhonchi Cardiovascular  Auscultation:  Regular rate, without rubs, murmurs or gallops  Edema/varicosities:  Not grossly evident Abdominal  Soft,nontender, without masses, guarding or rebound.  Liver/spleen:  No organomegaly noted  Hernia:  None appreciated  Skin  Inspection:  Grossly normal   Breasts: Examined lying and sitting.     Right: Without masses, retractions, discharge or axillary adenopathy.     Left: Without masses, retractions, discharge or axillary adenopathy. Gentitourinary   Inguinal/mons:  Normal without inguinal adenopathy  External genitalia:  Normal  BUS/Urethra/Skene's glands:  Normal  Vagina:  Normal  Cervix:  Normal  Uterus:  Normal in size, shape and contour.  Midline and mobile  Adnexa/parametria:     Rt: Without masses or tenderness.   Lt: Without masses or tenderness.  Anus and perineum: Normal  Digital rectal exam: Normal sphincter tone without palpated masses or tenderness  Assessment/Plan:  25 y.o. SBF G0 for annual exam. No concerns.  Morbid  obesity Pendulous breasts causing upper back and neck strain Ortho Cyclen - with regular cycle  Plan: Ortho Cyclen prescription, proper use, slight risk for blood clots and strokes reviewed. SBE's, increasing exercise, decreasing calories for weight loss. Low carb diet handout provided. Discussed breast reductions are done by plastic surgeon, instructed to follow-up after weight loss.  Discussed importance of weight loss in relationship to health. CBC, glucose, TSH, UA and Pap.   Harrington Challenger [facility], 10:13 AM 06/24/2015

## 2015-06-25 LAB — URINALYSIS W MICROSCOPIC + REFLEX CULTURE
BACTERIA UA: NONE SEEN [HPF]
Bilirubin Urine: NEGATIVE
CASTS: NONE SEEN [LPF]
CRYSTALS: NONE SEEN [HPF]
Glucose, UA: NEGATIVE
HGB URINE DIPSTICK: NEGATIVE
Ketones, ur: NEGATIVE
Leukocytes, UA: NEGATIVE
Nitrite: NEGATIVE
PROTEIN: NEGATIVE
RBC / HPF: NONE SEEN RBC/HPF (ref <=2)
SQUAMOUS EPITHELIAL / LPF: NONE SEEN [HPF] (ref <=5)
Specific Gravity, Urine: 1.021 (ref 1.001–1.035)
WBC UA: NONE SEEN WBC/HPF (ref <=5)
Yeast: NONE SEEN [HPF]
pH: 6.5 (ref 5.0–8.0)

## 2015-06-28 LAB — CYTOLOGY - PAP

## 2015-12-10 ENCOUNTER — Other Ambulatory Visit: Payer: Self-pay

## 2015-12-10 DIAGNOSIS — Z3041 Encounter for surveillance of contraceptive pills: Secondary | ICD-10-CM

## 2015-12-10 MED ORDER — NORGESTIMATE-ETH ESTRADIOL 0.25-35 MG-MCG PO TABS: 1.0000 | ORAL_TABLET | Freq: Every day | ORAL | Status: DC

## 2016-10-04 ENCOUNTER — Ambulatory Visit (INDEPENDENT_AMBULATORY_CARE_PROVIDER_SITE_OTHER): Payer: BLUE CROSS/BLUE SHIELD | Admitting: Women's Health

## 2016-10-04 ENCOUNTER — Encounter: Payer: Self-pay | Admitting: Women's Health

## 2016-10-04 VITALS — BP 128/76 | Ht 71.0 in | Wt 291.0 lb

## 2016-10-04 DIAGNOSIS — Z3041 Encounter for surveillance of contraceptive pills: Secondary | ICD-10-CM

## 2016-10-04 DIAGNOSIS — Z01419 Encounter for gynecological examination (general) (routine) without abnormal findings: Secondary | ICD-10-CM | POA: Diagnosis not present

## 2016-10-04 LAB — CBC WITH DIFFERENTIAL/PLATELET
BASOS PCT: 0 %
Basophils Absolute: 0 cells/uL (ref 0–200)
EOS ABS: 164 {cells}/uL (ref 15–500)
Eosinophils Relative: 2 %
HEMATOCRIT: 40.9 % (ref 35.0–45.0)
HEMOGLOBIN: 13.2 g/dL (ref 11.7–15.5)
LYMPHS ABS: 3198 {cells}/uL (ref 850–3900)
LYMPHS PCT: 39 %
MCH: 26.7 pg — ABNORMAL LOW (ref 27.0–33.0)
MCHC: 32.3 g/dL (ref 32.0–36.0)
MCV: 82.6 fL (ref 80.0–100.0)
MONO ABS: 656 {cells}/uL (ref 200–950)
MPV: 10.3 fL (ref 7.5–12.5)
Monocytes Relative: 8 %
Neutro Abs: 4182 cells/uL (ref 1500–7800)
Neutrophils Relative %: 51 %
Platelets: 357 10*3/uL (ref 140–400)
RBC: 4.95 MIL/uL (ref 3.80–5.10)
RDW: 14.2 % (ref 11.0–15.0)
WBC: 8.2 10*3/uL (ref 3.8–10.8)

## 2016-10-04 LAB — GLUCOSE, RANDOM: Glucose, Bld: 94 mg/dL (ref 65–99)

## 2016-10-04 MED ORDER — NORGESTIMATE-ETH ESTRADIOL 0.25-35 MG-MCG PO TABS
1.0000 | ORAL_TABLET | Freq: Every day | ORAL | 4 refills | Status: DC
Start: 2016-10-04 — End: 2016-10-06

## 2016-10-04 NOTE — Progress Notes (Signed)
Jodi Castillo 1991-04-24 [id number]    History:    Presents for annual exam.  Monthly cycle on Sprintec without complaint. Same partner with negative STD screen. Gardasil series completed. Normal Pap history. Morbid obesity currently on Weight Watchers and trying to increase exercise.  Past medical history, past surgical history, family history and social history were all reviewed and documented in the EPIC chart. Works at [employer]. 2 sisters, one with Crohn's, 1 healthy. Mother lives in [place].  ROS:  A ROS was performed and pertinent positives and negatives are included.  Exam:  Vitals:   10/04/16 1051  BP: 128/76  Weight: 291 lb (132 kg)  Height: 5\' 11"  (1.803 m)   Body mass index is 40.59 kg/m.   General appearance:  Normal Thyroid:  Symmetrical, normal in size, without palpable masses or nodularity. Respiratory  Auscultation:  Clear without wheezing or rhonchi Cardiovascular  Auscultation:  Regular rate, without rubs, murmurs or gallops  Edema/varicosities:  Not grossly evident Abdominal  Soft,nontender, without masses, guarding or rebound.  Liver/spleen:  No organomegaly noted  Hernia:  None appreciated  Skin  Inspection:  Grossly normal   Breasts: Examined lying and sitting. Pendulous    Right: Without masses, retractions, discharge or axillary adenopathy.     Left: Without masses, retractions, discharge or axillary adenopathy. Gentitourinary   Inguinal/mons:  Normal without inguinal adenopathy  External genitalia:  Normal  BUS/Urethra/Skene's glands:  Normal  Vagina:  Normal  Cervix:  Normal  Uterus:   normal in size, shape and contour.  Midline and mobile  Adnexa/parametria:     Rt: Without masses or tenderness.   Lt: Without masses or tenderness.  Anus and perineum: Normal  Digital rectal exam: Normal sphincter tone without palpated masses or tenderness  Assessment/Plan:  26 y.o. SBF G0  for annual exam with complaint of upper back/neck pain  from pendulous breasts.  Monthly Cycle on Sprintec Pendulous breasts Morbid obesity  Plan: Sprintec prescription, proper use, slight risk for blood clots and strokes reviewed. SBE's, exercise, calcium rich diet, MVI daily encouraged. Continue exercise routine and Weight Watchers. Contemplating a breast reduction plans to look into after weight loss achieved. CBC, glucose, (normal lipid panel 2017). Paps normal 2017, new screening guidelines reviewed.    Harrington Chall[facility], 1:00 PM 10/04/2016

## 2016-10-04 NOTE — Patient Instructions (Signed)
Carbohydrate Counting for Diabetes Mellitus, Adult Carbohydrate counting is a method for keeping track of how many carbohydrates you eat. Eating carbohydrates naturally increases the amount of sugar (glucose) in the blood. Counting how many carbohydrates you eat helps keep your blood glucose within normal limits, which helps you manage your diabetes (diabetes mellitus). It is important to know how many carbohydrates you can safely have in each meal. This is different for every person. A diet and nutrition specialist (registered dietitian) can help you make a meal plan and calculate how many carbohydrates you should have at each meal and snack. Carbohydrates are found in the following foods:  Grains, such as breads and cereals.  Dried beans and soy products.  Starchy vegetables, such as potatoes, peas, and corn.  Fruit and fruit juices.  Milk and yogurt.  Sweets and snack foods, such as cake, cookies, candy, chips, and soft drinks. How do I count carbohydrates? There are two ways to count carbohydrates in food. You can use either of the methods or a combination of both. Reading "Nutrition Facts" on packaged food  The "Nutrition Facts" list is included on the labels of almost all packaged foods and beverages in the [place] It includes:  The serving size.  Information about nutrients in each serving, including the grams (g) of carbohydrate per serving. To use the "Nutrition Facts":  Decide how many servings you will have.  Multiply the number of servings by the number of carbohydrates per serving.  The resulting number is the total amount of carbohydrates that you will be having. Learning standard serving sizes of other foods  When you eat foods containing carbohydrates that are not packaged or do not include "Nutrition Facts" on the label, you need to measure the servings in order to count the amount of carbohydrates:  Measure the foods that you will eat with a food scale or measuring  cup, if needed.  Decide how many standard-size servings you will eat.  Multiply the number of servings by 15. Most carbohydrate-rich foods have about 15 g of carbohydrates per serving.  For example, if you eat 8 oz (170 g) of strawberries, you will have eaten 2 servings and 30 g of carbohydrates (2 servings x 15 g = 30 g).  For foods that have more than one food mixed, such as soups and casseroles, you must count the carbohydrates in each food that is included. The following list contains standard serving sizes of common carbohydrate-rich foods. Each of these servings has about 15 g of carbohydrates:   hamburger bun or  English muffin.   oz (15 mL) syrup.   oz (14 g) jelly.  1 slice of bread.  1 six-inch tortilla.  3 oz (85 g) cooked rice or pasta.  4 oz (113 g) cooked dried beans.  4 oz (113 g) starchy vegetable, such as peas, corn, or potatoes.  4 oz (113 g) hot cereal.  4 oz (113 g) mashed potatoes or  of a large baked potato.  4 oz (113 g) canned or frozen fruit.  4 oz (120 mL) fruit juice.  4-6 crackers.  6 chicken nuggets.  6 oz (170 g) unsweetened dry cereal.  6 oz (170 g) plain fat-free yogurt or yogurt sweetened with artificial sweeteners.  8 oz (240 mL) milk.  8 oz (170 g) fresh fruit or one small piece of fruit.  24 oz (680 g) popped popcorn. Example of carbohydrate counting Sample meal   3 oz (85 g) chicken breast.  6  oz (170 g) brown rice.  4 oz (113 g) corn.  8 oz (240 mL) milk.  8 oz (170 g) strawberries with sugar-free whipped topping. Carbohydrate calculation  1. Identify the foods that contain carbohydrates:  Rice.  Corn.  Milk.  Strawberries. 2. Calculate how many servings you have of each food:  2 servings rice.  1 serving corn.  1 serving milk.  1 serving strawberries. 3. Multiply each number of servings by 15 g:  2 servings rice x 15 g = 30 g.  1 serving corn x 15 g = 15 g.  1 serving milk x 15 g = 15  g.  1 serving strawberries x 15 g = 15 g. 4. Add together all of the amounts to find the total grams of carbohydrates eaten:  30 g + 15 g + 15 g + 15 g = 75 g of carbohydrates total. This information is not intended to replace advice given to you by your health care provider. Make sure you discuss any questions you have with your health care provider. Document Released: 05/15/2005 Document Revised: 12/03/2015 Document Reviewed: 10/27/2015 Elsevier Interactive Patient Education  2017 [employer] Health Maintenance, Female Adopting a healthy lifestyle and getting preventive care can go a long way to promote health and wellness. Talk with your health care provider about what schedule of regular examinations is right for you. This is a good chance for you to check in with your provider about disease prevention and staying healthy. In between checkups, there are plenty of things you can do on your own. Experts have done a lot of research about which lifestyle changes and preventive measures are most likely to keep you healthy. Ask your health care provider for more information. Weight and diet Eat a healthy diet  Be sure to include plenty of vegetables, fruits, low-fat dairy products, and lean protein.  Do not eat a lot of foods high in solid fats, added sugars, or salt.  Get regular exercise. This is one of the most important things you can do for your health.  Most adults should exercise for at least 150 minutes each week. The exercise should increase your heart rate and make you sweat (moderate-intensity exercise).  Most adults should also do strengthening exercises at least twice a week. This is in addition to the moderate-intensity exercise. Maintain a healthy weight  Body mass index (BMI) is a measurement that can be used to identify possible weight problems. It estimates body fat based on height and weight. Your health care provider can help determine your BMI and help you achieve or  maintain a healthy weight.  For females 20 years of age and older:  A BMI below 18.5 is considered underweight.  A BMI of 18.5 to 24.9 is normal.  A BMI of 25 to 29.9 is considered overweight.  A BMI of 30 and above is considered obese. Watch levels of cholesterol and blood lipids  You should start having your blood tested for lipids and cholesterol at 27 years of age, then have this test every 5 years.  You may need to have your cholesterol levels checked more often if:  Your lipid or cholesterol levels are high.  You are older than 27 years of age.  You are at high risk for heart disease. Cancer screening Lung Cancer  Lung cancer screening is recommended for adults 55-80 years old who are at high risk for lung cancer because of a history of smoking.  A yearly low-dose CT   scan of the lungs is recommended for people who:  Currently smoke.  Have quit within the past 15 years.  Have at least a 30-pack-year history of smoking. A pack year is smoking an average of one pack of cigarettes a day for 1 year.  Yearly screening should continue until it has been 15 years since you quit.  Yearly screening should stop if you develop a health problem that would prevent you from having lung cancer treatment. Breast Cancer  Practice breast self-awareness. This means understanding how your breasts normally appear and feel.  It also means doing regular breast self-exams. Let your health care provider know about any changes, no matter how small.  If you are in your 20s or 30s, you should have a clinical breast exam (CBE) by a health care provider every 1-3 years as part of a regular health exam.  If you are 40 or older, have a CBE every year. Also consider having a breast X-ray (mammogram) every year.  If you have a family history of breast cancer, talk to your health care provider about genetic screening.  If you are at high risk for breast cancer, talk to your health care provider  about having an MRI and a mammogram every year.  Breast cancer gene (BRCA) assessment is recommended for women who have family members with BRCA-related cancers. BRCA-related cancers include:  Breast.  Ovarian.  Tubal.  Peritoneal cancers.  Results of the assessment will determine the need for genetic counseling and BRCA1 and BRCA2 testing. Cervical Cancer  Your health care provider may recommend that you be screened regularly for cancer of the pelvic organs (ovaries, uterus, and vagina). This screening involves a pelvic examination, including checking for microscopic changes to the surface of your cervix (Pap test). You may be encouraged to have this screening done every 3 years, beginning at age 21.  For women ages 30-65, health care providers may recommend pelvic exams and Pap testing every 3 years, or they may recommend the Pap and pelvic exam, combined with testing for human papilloma virus (HPV), every 5 years. Some types of HPV increase your risk of cervical cancer. Testing for HPV may also be done on women of any age with unclear Pap test results.  Other health care providers may not recommend any screening for nonpregnant women who are considered low risk for pelvic cancer and who do not have symptoms. Ask your health care provider if a screening pelvic exam is right for you.  If you have had past treatment for cervical cancer or a condition that could lead to cancer, you need Pap tests and screening for cancer for at least 20 years after your treatment. If Pap tests have been discontinued, your risk factors (such as having a new sexual partner) need to be reassessed to determine if screening should resume. Some women have medical problems that increase the chance of getting cervical cancer. In these cases, your health care provider may recommend more frequent screening and Pap tests. Colorectal Cancer  This type of cancer can be detected and often prevented.  Routine colorectal  cancer screening usually begins at 27 years of age and continues through 27 years of age.  Your health care provider may recommend screening at an earlier age if you have risk factors for colon cancer.  Your health care provider may also recommend using home test kits to check for hidden blood in the stool.  A small camera at the end of a tube can be used   to examine your colon directly (sigmoidoscopy or colonoscopy). This is done to check for the earliest forms of colorectal cancer.  Routine screening usually begins at age 69.  Direct examination of the colon should be repeated every 5-10 years through 27 years of age. However, you may need to be screened more often if early forms of precancerous polyps or small growths are found. Skin Cancer  Check your skin from head to toe regularly.  Tell your health care provider about any new moles or changes in moles, especially if there is a change in a mole's shape or color.  Also tell your health care provider if you have a mole that is larger than the size of a pencil eraser.  Always use sunscreen. Apply sunscreen liberally and repeatedly throughout the day.  Protect yourself by wearing long sleeves, pants, a wide-brimmed hat, and sunglasses whenever you are outside. Heart disease, diabetes, and high blood pressure  High blood pressure causes heart disease and increases the risk of stroke. High blood pressure is more likely to develop in:  People who have blood pressure in the high end of the normal range (130-139/85-89 mm Hg).  People who are overweight or obese.  People who are African American.  If you are 6-34 years of age, have your blood pressure checked every 3-5 years. If you are 54 years of age or older, have your blood pressure checked every year. You should have your blood pressure measured twice-once when you are at a hospital or clinic, and once when you are not at a hospital or clinic. Record the average of the two  measurements. To check your blood pressure when you are not at a hospital or clinic, you can use:  An automated blood pressure machine at a pharmacy.  A home blood pressure monitor.  If you are between 36 years and 36 years old, ask your health care provider if you should take aspirin to prevent strokes.  Have regular diabetes screenings. This involves taking a blood sample to check your fasting blood sugar level.  If you are at a normal weight and have a low risk for diabetes, have this test once every three years after 27 years of age.  If you are overweight and have a high risk for diabetes, consider being tested at a younger age or more often. Preventing infection Hepatitis B  If you have a higher risk for hepatitis B, you should be screened for this virus. You are considered at high risk for hepatitis B if:  You were born in a country where hepatitis B is common. Ask your health care provider which countries are considered high risk.  Your parents were born in a high-risk country, and you have not been immunized against hepatitis B (hepatitis B vaccine).  You have HIV or AIDS.  You use needles to inject street drugs.  You live with someone who has hepatitis B.  You have had sex with someone who has hepatitis B.  You get hemodialysis treatment.  You take certain medicines for conditions, including cancer, organ transplantation, and autoimmune conditions. Hepatitis C  Blood testing is recommended for:  Everyone born from [birth year] through 1966.  Anyone with known risk factors for hepatitis C. Sexually transmitted infections (STIs)  You should be screened for sexually transmitted infections (STIs) including gonorrhea and chlamydia if:  You are sexually active and are younger than 27 years of age.  You are older than 27 years of age and your health care provider  tells you that you are at risk for this type of infection.  Your sexual activity has changed since you were last  screened and you are at an increased risk for chlamydia or gonorrhea. Ask your health care provider if you are at risk.  If you do not have HIV, but are at risk, it may be recommended that you take a prescription medicine daily to prevent HIV infection. This is called pre-exposure prophylaxis (PrEP). You are considered at risk if:  You are sexually active and do not regularly use condoms or know the HIV status of your partner(s).  You take drugs by injection.  You are sexually active with a partner who has HIV. Talk with your health care provider about whether you are at high risk of being infected with HIV. If you choose to begin PrEP, you should first be tested for HIV. You should then be tested every 3 months for as long as you are taking PrEP. Pregnancy  If you are premenopausal and you may become pregnant, ask your health care provider about preconception counseling.  If you may become pregnant, take 400 to 800 micrograms (mcg) of folic acid every day.  If you want to prevent pregnancy, talk to your health care provider about birth control (contraception). Osteoporosis and menopause  Osteoporosis is a disease in which the bones lose minerals and strength with aging. This can result in serious bone fractures. Your risk for osteoporosis can be identified using a bone density scan.  If you are 30 years of age or older, or if you are at risk for osteoporosis and fractures, ask your health care provider if you should be screened.  Ask your health care provider whether you should take a calcium or vitamin D supplement to lower your risk for osteoporosis.  Menopause may have certain physical symptoms and risks.  Hormone replacement therapy may reduce some of these symptoms and risks. Talk to your health care provider about whether hormone replacement therapy is right for you. Follow these instructions at home:  Schedule regular health, dental, and eye exams.  Stay current with your  immunizations.  Do not use any tobacco products including cigarettes, chewing tobacco, or electronic cigarettes.  If you are pregnant, do not drink alcohol.  If you are breastfeeding, limit how much and how often you drink alcohol.  Limit alcohol intake to no more than 1 drink per day for nonpregnant women. One drink equals 12 ounces of beer, 5 ounces of wine, or 1 ounces of hard liquor.  Do not use street drugs.  Do not share needles.  Ask your health care provider for help if you need support or information about quitting drugs.  Tell your health care provider if you often feel depressed.  Tell your health care provider if you have ever been abused or do not feel safe at home. This information is not intended to replace advice given to you by your health care provider. Make sure you discuss any questions you have with your health care provider. Document Released: 11/28/2010 Document Revised: 10/21/2015 Document Reviewed: 02/16/2015 Elsevier Interactive Patient Education  2017 [employer].

## 2016-10-06 ENCOUNTER — Telehealth: Payer: Self-pay | Admitting: *Deleted

## 2016-10-06 DIAGNOSIS — Z3041 Encounter for surveillance of contraceptive pills: Secondary | ICD-10-CM

## 2016-10-06 MED ORDER — NORGESTIMATE-ETH ESTRADIOL 0.25-35 MG-MCG PO TABS: 1.0000 | ORAL_TABLET | Freq: Every day | ORAL | 4 refills | Status: DC

## 2016-10-06 NOTE — Telephone Encounter (Signed)
Pt called requesting birth control pills sent pharmacy [facility] in [place]. Rx sent.

## 2016-10-11 ENCOUNTER — Encounter: Payer: Self-pay | Admitting: Gynecology

## 2020-02-03 DIAGNOSIS — Z0000 Encounter for general adult medical examination without abnormal findings: Secondary | ICD-10-CM | POA: Diagnosis not present

## 2020-02-03 DIAGNOSIS — Z6841 Body Mass Index (BMI) 40.0 and over, adult: Secondary | ICD-10-CM | POA: Diagnosis not present

## 2020-02-03 DIAGNOSIS — Z23 Encounter for immunization: Secondary | ICD-10-CM | POA: Diagnosis not present

## 2020-02-17 DIAGNOSIS — I10 Essential (primary) hypertension: Secondary | ICD-10-CM | POA: Diagnosis not present

## 2020-02-17 DIAGNOSIS — F411 Generalized anxiety disorder: Secondary | ICD-10-CM | POA: Diagnosis not present

## 2020-03-03 DIAGNOSIS — I10 Essential (primary) hypertension: Secondary | ICD-10-CM | POA: Diagnosis not present

## 2021-01-07 ENCOUNTER — Other Ambulatory Visit: Payer: Self-pay

## 2021-01-07 ENCOUNTER — Encounter (HOSPITAL_BASED_OUTPATIENT_CLINIC_OR_DEPARTMENT_OTHER): Payer: Self-pay

## 2021-01-07 DIAGNOSIS — R202 Paresthesia of skin: Secondary | ICD-10-CM | POA: Insufficient documentation

## 2021-01-07 DIAGNOSIS — Z5321 Procedure and treatment not carried out due to patient leaving prior to being seen by health care provider: Secondary | ICD-10-CM | POA: Insufficient documentation

## 2021-01-07 DIAGNOSIS — M5416 Radiculopathy, lumbar region: Secondary | ICD-10-CM | POA: Diagnosis not present

## 2021-01-07 NOTE — ED Triage Notes (Signed)
Pt c/o numbness to left LE 1 week ago-numbness to left arm started today ~3pm-pt seen at [facility] today for same-had xrays to back and was given rx prednisone-NAD-steady gait

## 2021-01-08 ENCOUNTER — Emergency Department (HOSPITAL_BASED_OUTPATIENT_CLINIC_OR_DEPARTMENT_OTHER)
Admission: EM | Admit: 2021-01-08 | Discharge: 2021-01-08 | Disposition: A | Discharge status: Home / Self Care | Payer: BC Managed Care – PPO | Attending: Emergency Medicine | Admitting: Emergency Medicine

## 2021-01-08 HISTORY — DX: Essential (primary) hypertension: I10

## 2021-01-12 ENCOUNTER — Encounter (HOSPITAL_BASED_OUTPATIENT_CLINIC_OR_DEPARTMENT_OTHER): Payer: Self-pay

## 2021-01-12 ENCOUNTER — Emergency Department (HOSPITAL_COMMUNITY): Payer: BC Managed Care – PPO

## 2021-01-12 ENCOUNTER — Other Ambulatory Visit: Payer: Self-pay

## 2021-01-12 ENCOUNTER — Emergency Department (HOSPITAL_BASED_OUTPATIENT_CLINIC_OR_DEPARTMENT_OTHER)
Admission: EM | Admit: 2021-01-12 | Discharge: 2021-01-12 | Disposition: A | Discharge status: Home / Self Care | Payer: BC Managed Care – PPO | Attending: Emergency Medicine | Admitting: Emergency Medicine

## 2021-01-12 DIAGNOSIS — Z87891 Personal history of nicotine dependence: Secondary | ICD-10-CM | POA: Insufficient documentation

## 2021-01-12 DIAGNOSIS — G35 Multiple sclerosis: Secondary | ICD-10-CM

## 2021-01-12 DIAGNOSIS — G9389 Other specified disorders of brain: Secondary | ICD-10-CM | POA: Diagnosis not present

## 2021-01-12 DIAGNOSIS — M40202 Unspecified kyphosis, cervical region: Secondary | ICD-10-CM | POA: Diagnosis not present

## 2021-01-12 DIAGNOSIS — R202 Paresthesia of skin: Secondary | ICD-10-CM | POA: Diagnosis not present

## 2021-01-12 DIAGNOSIS — N9489 Other specified conditions associated with female genital organs and menstrual cycle: Secondary | ICD-10-CM | POA: Diagnosis not present

## 2021-01-12 DIAGNOSIS — Z79899 Other long term (current) drug therapy: Secondary | ICD-10-CM | POA: Diagnosis not present

## 2021-01-12 DIAGNOSIS — R200 Anesthesia of skin: Secondary | ICD-10-CM

## 2021-01-12 DIAGNOSIS — I10 Essential (primary) hypertension: Secondary | ICD-10-CM | POA: Insufficient documentation

## 2021-01-12 HISTORY — DX: Multiple sclerosis: G35

## 2021-01-12 LAB — CBC WITH DIFFERENTIAL/PLATELET
Abs Immature Granulocytes: 0.07 10*3/uL (ref 0.00–0.07)
Basophils Absolute: 0 10*3/uL (ref 0.0–0.1)
Basophils Relative: 0 %
Eosinophils Absolute: 0 10*3/uL (ref 0.0–0.5)
Eosinophils Relative: 0 %
HCT: 39.3 % (ref 36.0–46.0)
Hemoglobin: 13.1 g/dL (ref 12.0–15.0)
Immature Granulocytes: 1 %
Lymphocytes Relative: 20 %
Lymphs Abs: 2.4 10*3/uL (ref 0.7–4.0)
MCH: 27.6 pg (ref 26.0–34.0)
MCHC: 33.3 g/dL (ref 30.0–36.0)
MCV: 82.7 fL (ref 80.0–100.0)
Monocytes Absolute: 0.7 10*3/uL (ref 0.1–1.0)
Monocytes Relative: 6 %
Neutro Abs: 9 10*3/uL — ABNORMAL HIGH (ref 1.7–7.7)
Neutrophils Relative %: 73 %
Platelets: 375 10*3/uL (ref 150–400)
RBC: 4.75 MIL/uL (ref 3.87–5.11)
RDW: 12.8 % (ref 11.5–15.5)
WBC: 12.3 10*3/uL — ABNORMAL HIGH (ref 4.0–10.5)
nRBC: 0 % (ref 0.0–0.2)

## 2021-01-12 LAB — BASIC METABOLIC PANEL
Anion gap: 8 (ref 5–15)
BUN: 15 mg/dL (ref 6–20)
CO2: 24 mmol/L (ref 22–32)
Calcium: 8.4 mg/dL — ABNORMAL LOW (ref 8.9–10.3)
Chloride: 104 mmol/L (ref 98–111)
Creatinine, Ser: 0.84 mg/dL (ref 0.44–1.00)
GFR, Estimated: >60 mL/min (ref >60)
Glucose, Bld: 113 mg/dL — ABNORMAL HIGH (ref 70–99)
Potassium: 3.9 mmol/L (ref 3.5–5.1)
Sodium: 136 mmol/L (ref 135–145)

## 2021-01-12 LAB — HCG, QUANTITATIVE, PREGNANCY: hCG, Beta Chain, Quant, S: <1 m[IU]/mL (ref <5)

## 2021-01-12 MED ORDER — PREDNISONE 50 MG PO TABS: 625.0000 mg | ORAL_TABLET | Freq: Every day | ORAL | 0 refills | Status: AC

## 2021-01-12 MED ORDER — METHYLPREDNISOLONE SODIUM SUCC 1000 MG IJ SOLR
1000.0000 mg | Freq: Once | INTRAMUSCULAR | Status: AC
  Administered 2021-01-12: 1000 mg via INTRAVENOUS
  Filled 2021-01-12: qty 8

## 2021-01-12 MED ORDER — GADOBUTROL 1 MMOL/ML IV SOLN
10.0000 mL | Freq: Once | INTRAVENOUS | Status: AC | PRN
  Administered 2021-01-12: 10 mL via INTRAVENOUS

## 2021-01-12 MED ORDER — PANTOPRAZOLE SODIUM 40 MG IV SOLR
40.0000 mg | Freq: Once | INTRAVENOUS | Status: AC
  Administered 2021-01-12: 40 mg via INTRAVENOUS
  Filled 2021-01-12: qty 40

## 2021-01-12 NOTE — Consult Note (Signed)
Neurology Consultation  Reason for Consult: MRI brain with active demyelinating lesions concerning for MS Referring Physician: Dr. Anitra Lauth  CC: Tingling of the dorsal right forearm and hand  History is obtained from: Patient, Chart review  HPI: Jodi Castillo is a 30 y.o. female with a past medical history significant for essential hypertension and obesity with BMI 45.92 who presented initially to [facility] for evaluation of left-sided numbness and tingling. She states that she noticed left foot numbness and heaviness with onset approximately 2 weeks ago that has since progressed up her left lower extremity and into her left hand and forearm. Her foot numbness resolved after approximately 3 days but she was concerned with the ascending progression of her sensory disturbances with constant and persistent tingling of her dorsal left forearm and hand. She also endorses associated fatigue and finger tingling with increased use such as with typing or brushing her hair. She denies bowel or bladder changes, vision changes, or a history of transient sensory changes or weakness in the past. She was initially seen at urgent care 01/07/2021 and had plain films of the lumbar spine and was given a prednisone dose-pack without relief and presented to the [facility] ED for further evaluation. She was subsequently transferred to [facility] for MRI brain and cervical spine imaging with contrast for evaluation of possible Multiple Sclerosis.   Patient denies family history of MS but her mother endorses a history of psoriasis, psoriatic arthritis, and Chron's disease.   ROS: A complete ROS was performed and is negative except as noted in the HPI.  Past Medical History:  Diagnosis Date   Hypertension    Irregular periods/menstrual cycles    Family History  Problem Relation Age of Onset   Hypertension Mother   Mother: Crohn's disease, psoriasis, psoriatic arthritis  Social History:   reports that  she has quit smoking. Her smoking use included cigarettes. She has never used smokeless tobacco. She reports that she does not drink alcohol and does not use drugs.  Medications No current facility-administered medications for this encounter.  Current Outpatient Medications:    predniSONE (STERAPRED UNI-PAK 21 TAB) 10 MG (21) TBPK tablet, Take by mouth., Disp: , Rfl:    hydrochlorothiazide (MICROZIDE) 12.5 MG capsule, Take 12.5 mg by mouth daily., Disp: , Rfl:    Multiple Vitamin (MULTIVITAMIN) tablet, Take 1 tablet by mouth daily., Disp: , Rfl:    norgestimate-ethinyl estradiol (ORTHO-CYCLEN,SPRINTEC,PREVIFEM) 0.25-35 MG-MCG tablet, Take 1 tablet by mouth daily., Disp: 3 Package, Rfl: 4   propranolol (INDERAL) 40 MG tablet, Take 40 mg by mouth 2 (two) times daily., Disp: , Rfl:   Exam: Current vital signs: BP 119/77   Pulse 71   Temp 98.5 F (36.9 C) (Oral)   Resp (!) 21   Ht 5\' 10"  (1.778 m)   Wt (!) 145.2 kg   LMP 01/05/2021   SpO2 99%   BMI 45.92 kg/m  Vital signs in last 24 hours: Temp:  [98.4 F (36.9 C)-98.5 F (36.9 C)] 98.5 F (36.9 C) (08/17 0829) Pulse Rate:  [64-74] 71 (08/17 0930) Resp:  [14-21] 21 (08/17 0930) BP: (119-140)/(76-113) 119/77 (08/17 0930) SpO2:  [96 %-100 %] 99 % (08/17 0930) Weight:  [145.2 kg] 145.2 kg (08/17 0307)  GENERAL: Awake, alert, sitting up in bed with mother at bedside, in no acute distress Psych: Affect appropriate for situation, patient is calm and cooperative with examination Head: Normocephalic and atraumatic, without obvious abnormality EENT: Normal conjunctivae, dry mucous membranes, no  OP obstruction LUNGS: Normal respiratory effort. Non-labored breathing on room air, SpO2 99% on telemetry CV: Regular rate and rhythm on telemetry ABDOMEN: Soft, non-tender, non-distended Extremities: warm, well perfused, without obvious deformity  NEURO:  Mental Status: Awake, alert, and oriented to person, place, time, and situation. She is  able to provide a clear and coherent history of present illness. Speech/Language: speech is intact without dysarthria.   Naming, repetition, fluency, and comprehension intact without aphasia. No neglect is noted Cranial Nerves:  II: PERRL 5 mm/brisk. Visual fields full.  III, IV, VI: EOMI without ptosis or diplopia V: Sensation is intact to light touch and symmetrical to face.  VII: Face is symmetric resting and smiling.  VIII: Hearing is intact to voice IX, X: Palate elevation is symmetric. Phonation normal.  XI: Normal sternocleidomastoid and trapezius muscle strength XII: Tongue protrudes midline without fasciculations.   Motor: 5/5 strength is all muscle groups without vertical drift. Tone is normal. Bulk is normal.  Sensation: Intact to light touch bilaterally in all four extremities.  Subjective reports of tingling on the dorsal mid forearm and distally to the dorsal hand without current finger involvement.  Sensation to cool temperature with asymmetry; cool temperature sensation increased on the left ankle compared to the right and cool temperature on the right leg proximal to the ankle is increased compared to the left. Coordination: FTN intact bilaterally. HKS intact bilaterally. DTRs: 2+and symmetric biceps and brachioradialis, 1+ and symmetric patellae Gait: Deferred  Labs I have reviewed labs in epic and the results pertinent to this consultation are: CBC    Component Value Date/Time   WBC 12.3 (H) 01/12/2021 0422   RBC 4.75 01/12/2021 0422   HGB 13.1 01/12/2021 0422   HCT 39.3 01/12/2021 0422   PLT 375 01/12/2021 0422   MCV 82.7 01/12/2021 0422   MCH 27.6 01/12/2021 0422   MCHC 33.3 01/12/2021 0422   RDW 12.8 01/12/2021 0422   LYMPHSABS 2.4 01/12/2021 0422   MONOABS 0.7 01/12/2021 0422   EOSABS 0.0 01/12/2021 0422   BASOSABS 0.0 01/12/2021 0422   CMP     Component Value Date/Time   NA 136 01/12/2021 0422   K 3.9 01/12/2021 0422   CL 104 01/12/2021 0422    CO2 24 01/12/2021 0422   GLUCOSE 113 (H) 01/12/2021 0422   BUN 15 01/12/2021 0422   CREATININE 0.84 01/12/2021 0422   CALCIUM 8.4 (L) 01/12/2021 0422   GFRNONAA >60 01/12/2021 0422   Lipid Panel     Component Value Date/Time   CHOL 129 06/24/2015 1005   TRIG 140 06/24/2015 1005   HDL 26 (L) 06/24/2015 1005   CHOLHDL 5.0 06/24/2015 1005   VLDL 28 06/24/2015 1005   LDLCALC 75 06/24/2015 1005   Imaging I have reviewed the images obtained:  MR BRAIN 01/12/2021: Findings above consistent with the provided history of multiple sclerosis with evidence of active demyelination involving two lesions in the right cerebral hemisphere described above.   MR CERVICAL SPINE 01/12/2021: No definite evidence of demyelinating lesion in the cervical spine, within the confines of motion degraded images. No abnormal enhancement.  Assessment: 30 y.o. female who presents for evaluation of left-sided numbness and tingling. Initially, numbness was in the left foot and progressed up the left lower extremity with resolution in approximately 3 days. Now, tingling is persistently felt on the dorsal mid left forearm and distally to the dorsal hand with intermittent finger involvement with resolution of left lower extremity sensory deficits. MRI brain and  c-spine with and without contrast reveal two lesions in the right cerebral hemisphere with peripheral enhancement consistent with active demyelination as well as multiple foci of FLAIR signal abnormality in the bilateral periventricular white matter. With clinical symptoms consistent with MS and MRI imaging demonstrating dissemination in space and time, presentation is consistent with Multiple Sclerosis.  Impression: - Multiple Sclerosis  Recommendations: - 1g IV Solu-Medrol once  - PPI prophylaxis while on steroids - 625 mg Prednisone PO daily x 2 days at discharge - Recommend outpatient neurology follow up with [facility] in 2 weeks- ambulatory referral placed  Pt  seen by NP/Neuro and later by MD. Note/plan to be edited by MD as needed.  Jodi Castillo, AGAC-NP [facility] Pager: [phone]   I have seen the patient and reviewed the above note.  She has some left hemibody numbness, but no weakness or other disability.    Her MRI establishes dissemination in time based on enhancing and nonenhancing lesions, as well as dissemination in space based on periventricular and juxtacortical lesions.  This is adequate to fill McDonald criteria and I do not feel that further evaluation is needed.  I think that outpatient treatment with steroids is appropriate given her mild symptoms.  She will need to be started on disease modifying therapy as an outpatient.  I will send vitamin D and JC virus antibody which could help guide further treatment as outpatient.   Ritta Slot, MD [facility] [phone]  If 7pm- 7am, please page neurology on call as listed in AMION.

## 2021-01-12 NOTE — Discharge Instructions (Addendum)
It will be important for you to take the steroids tomorrow and Friday.  It will be a total of 625mg .  Neurology will then follow up in  the future.  Make sure you eat something when taking the steroids and it is fine for you to keep taking your blood pressure medication however it is not uncommon for steroids to sometimes make your blood pressure higher than normal and the blood sugar.  It should return to normal when you stop taking.  Also sometimes it can make you feel jittery or feel like your heart is racing.  That is also a common side effect of prednisone.

## 2021-01-12 NOTE — ED Notes (Signed)
Patient transported to MRI 

## 2021-01-12 NOTE — ED Provider Notes (Addendum)
Patient is a 30 year old female with a history of hypertension who is being transferred from [facility] today for MRI of her brain and cervical spine due to 5 to 6 days of vague numbness and tingling in her left leg that has gradually improved in her foot but has moved up to involve her left groin and her left lower arm from the elbow down.  She denies any weakness in the extremities, visual changes or speech issues.  She has no headache or neck pain.  No prior history of similar symptoms.  She did go to urgent care 2 days ago and was given prednisone but does not not sure that it has made any improvement.  MRI of the cervical spine today shows no evidence of demyelinating lesions however the brain shows evidence of active demyelination and 2 lesions in the right cerebral hemisphere concerning for MS.  Findings discussed with the patient.  Will discuss with neurology.  Remainder of labs are normal except for mild leukocytosis of 12 which is most likely from recent initiation of prednisone.  Patient is well-appearing on exam.  Pt seen by Dr. Amada Jupiter and pt received 1g of solumedrol and will take 625mg  of prednisone over the next 2 days and then f/u with neurology.   , MD 01/12/21 01/14/21    1448, MD 01/12/21 1253

## 2021-01-12 NOTE — ED Provider Notes (Signed)
[facility] EMERGENCY DEPARTMENT Provider Note   CSN: [id number] Arrival date & time: 01/12/21  0302     History Chief Complaint  Patient presents with   Numbness    Jodi Castillo is a 30 y.o. female.  HPI     This is a 30 year old female with a history of hypertension who presents with left-sided numbness.  Patient reports she had onset of left-sided numbness 2 weeks ago.  Initially started in her foot.  She states that her foot felt heavy and tingly.  Progressed to the upper leg.  She was seen and evaluated at an urgent care and had plain films of her back.  She was given prednisone.  She states she does not feel like that helped much.  Since that time she has developed tingling in the left hand and arm.  She is left-handed.  She denies weakness but does state that the hand sometimes feels heavy.  She denies any vision changes.  No neck pain or back pain.  No headache.  She denies any bowel or bladder difficulty.  No speech difficulty.  Chart reviewed.  She was seen on 8/12 urgent care.  She had negative plain films of the lower lumbar spine.  She was placed on steroids without any relief.  Past Medical History:  Diagnosis Date   Hypertension    Irregular periods/menstrual cycles     Patient Active Problem List   Diagnosis Date Noted   Morbid obesity (HCC) 09/27/2012    History reviewed. No pertinent surgical history.   OB History     Gravida  0   Para      Term      Preterm      AB      Living         SAB      IAB      Ectopic      Multiple      Live Births              Family History  Problem Relation Age of Onset   Hypertension Mother     Social History   Tobacco Use   Smoking status: Former    Types: Cigarettes   Smokeless tobacco: Never  Vaping Use   Vaping Use: Never used  Substance Use Topics   Alcohol use: No   Drug use: No    Home Medications Prior to Admission medications   Medication Sig Start Date End  Date Taking? Authorizing Provider  predniSONE (STERAPRED UNI-PAK 21 TAB) 10 MG (21) TBPK tablet Take by mouth. 01/07/21  Yes [provider]  hydrochlorothiazide (MICROZIDE) 12.5 MG capsule Take 12.5 mg by mouth daily. 12/22/20   [provider]  Multiple Vitamin (MULTIVITAMIN) tablet Take 1 tablet by mouth daily.    [provider]  norgestimate-ethinyl estradiol (ORTHO-CYCLEN,SPRINTEC,PREVIFEM) 0.25-35 MG-MCG tablet Take 1 tablet by mouth daily. 10/06/16   Harrington Challenger, NP  propranolol (INDERAL) 40 MG tablet Take 40 mg by mouth 2 (two) times daily. 11/13/20   [provider]    Allergies    Patient has no known allergies.  Review of Systems   Review of Systems  Constitutional:  Negative for fever.  Respiratory:  Negative for shortness of breath.   Cardiovascular:  Negative for chest pain.  Gastrointestinal:  Negative for abdominal pain.  Musculoskeletal:  Negative for back pain.  Neurological:  Positive for numbness. Negative for headaches.  All other systems reviewed and are negative.  Physical Exam Updated Vital Signs BP (!) 137/113 (BP Location: Left Arm)   Pulse 74   Temp 98.4 F (36.9 C) (Oral)   Resp 16   Ht 1.778 m (5\' 10" )   Wt (!) 145.2 kg   LMP 01/05/2021   SpO2 97%   BMI 45.92 kg/m   Physical Exam Vitals and nursing note reviewed.  Constitutional:      Appearance: She is well-developed. She is obese. She is not ill-appearing.  HENT:     Head: Normocephalic and atraumatic.     Nose: Nose normal.     Mouth/Throat:     Mouth: Mucous membranes are moist.  Eyes:     Pupils: Pupils are equal, round, and reactive to light.  Cardiovascular:     Rate and Rhythm: Normal rate and regular rhythm.     Heart sounds: Normal heart sounds.  Pulmonary:     Effort: Pulmonary effort is normal. No respiratory distress.  Abdominal:     General: Bowel sounds are normal.     Palpations: Abdomen is soft.  Musculoskeletal:     Cervical back:  Neck supple.  Skin:    General: Skin is warm and dry.  Neurological:     Mental Status: She is alert and oriented to person, place, and time.     Comments: Fluent speech, cranial nerves II through XII intact, 5 out of 5 strength in all 4 extremities, no clonus, no drift, subjective difference in sensation to light touch of the bilateral lower extremity  Psychiatric:        Mood and Affect: Mood normal.    ED Results / Procedures / Treatments   Labs (all labs ordered are listed, but only abnormal results are displayed) Labs Reviewed  CBC WITH DIFFERENTIAL/PLATELET  BASIC METABOLIC PANEL  HCG, QUANTITATIVE, PREGNANCY    EKG None  Radiology No results found.  Procedures Procedures   Medications Ordered in ED Medications - No data to display  ED Course  I have reviewed the triage vital signs and the nursing notes.  Pertinent labs & imaging results that were available during my care of the patient were reviewed by me and considered in my medical decision making (see chart for details).  Clinical Course as of 01/12/21 0411  Wed Jan 12, 2021  0409 Spoke to Dr. Jan 14, 2021, neurology briefly.  Agrees that patient likely needs MRI both head and C-spine to evaluate for MS.  We will plan for transfer to [facility].  Discussed plan with [facility]. [facility] who has accepted patient. [CH]    Clinical Course User Index [CH] Abdinasir Spadafore, Adela Lank, MD   MDM Rules/Calculators/A&P                           Patient presents with numbness.  It is unilateral left-sided.  She is overall nontoxic and vital signs notable for blood pressure of 137/113.  The numbness has progressed over 2-week period of time.  Initially was thought to be related to potential lumbar herniation; however, now she has having symptoms in her left arm as well.  Her neurologic exam is objectively reassuring although she has subjective difference in light touch bilaterally.  Considerations include but not limited to, spinal  cord lesion, brain lesion, MS.  She cannot get into see her primary physician and does not have neurology as an outpatient.  See discussion above with neurology.  Plan for transfer to [facility] by POV for  MRI evaluation.  If positive, neurology will need to be officially consulted. Patient is agreeable to plan.  Final Clinical Impression(s) / ED Diagnoses Final diagnoses:  Numbness    Rx / DC Orders ED Discharge Orders     None        Jkai Arwood, Mayer Masker, MD 01/12/21 [phone]

## 2021-01-12 NOTE — ED Triage Notes (Addendum)
Patient states that she has had L sided numbness x2 weeks starting in her feet and radiates up body into hand; seen at urgent care and ED for same. Took prednisone with some relief.

## 2021-01-25 ENCOUNTER — Telehealth: Payer: Self-pay | Admitting: Neurology

## 2021-01-25 ENCOUNTER — Other Ambulatory Visit: Payer: Self-pay

## 2021-01-25 ENCOUNTER — Ambulatory Visit: Payer: BC Managed Care – PPO | Admitting: Neurology

## 2021-01-25 ENCOUNTER — Encounter: Payer: Self-pay | Admitting: Neurology

## 2021-01-25 VITALS — BP 135/92 | HR 89 | Ht 70.0 in | Wt 319.0 lb

## 2021-01-25 DIAGNOSIS — Z79899 Other long term (current) drug therapy: Secondary | ICD-10-CM | POA: Diagnosis not present

## 2021-01-25 DIAGNOSIS — E236 Other disorders of pituitary gland: Secondary | ICD-10-CM | POA: Diagnosis not present

## 2021-01-25 DIAGNOSIS — G35 Multiple sclerosis: Secondary | ICD-10-CM

## 2021-01-25 DIAGNOSIS — G35A Relapsing-remitting multiple sclerosis: Secondary | ICD-10-CM | POA: Insufficient documentation

## 2021-01-25 DIAGNOSIS — R200 Anesthesia of skin: Secondary | ICD-10-CM

## 2021-01-25 NOTE — Progress Notes (Signed)
[facility]  PATIENT: Jodi Castillo DOB: 1990/12/08  REFERRING DOCTOR OR PCP:  Maud Deed, PA-C SOURCE: Patient, notes from ED/hospital, imaging and lab reports reviewed, MRI images personally reviewed.  _________________________________   HISTORICAL  CHIEF COMPLAINT:  Chief Complaint  Patient presents with   New Patient (Initial Visit)    Rm 2, w mother Jodi Castillo. Internal referral for MS. Seen in the ED on 8/17, for L side numbness, started on her L foot and gradually moved up L side of body, w numbness and tingling. Pt had an MRI of the brian, lesions were found and was DX with MS. Numbness is now gone after high dose of prednisone.     HISTORY OF PRESENT ILLNESS:  I had the pleasure of seeing your patient, Jodi Castillo, at the MS center [facility] for neurologic consultation regarding her recent diagnosis of MS.  She is a 30 year old woman who had the onset of left foot and leg numbness around 01/05/2021 that increased and involved her left arm over the next 2 days.   She had fairly mild left facial numbness.   She did not note any definite weakness but she noted that her left leg gave out ad she stumbled a few times.     She went to Urgent Care because she though she had a pinched nerve and was prescribed a standard steroid pack.   A couple days later, as symptoms worsened, she also noted some leg pain, and she went to the ED.   She had brian and cervical spine imaging showing changes c/w MS.   Two white matter lesions enhanced after contrast c/w acute demyelinating lesions.   There were no lesions in the spinal cord though 1 is seen in the medulla.  She received IV steroids x 1 gram once and then was discharged on 625 mg prednisone x 2 more days.      Numbness resolved over the next few days and she currently feels back to baseline.  She denies any difficulties with gait, balance, strength or numbness at this time.  She has had more  fatigue the last couple years.   She generally sleeps well but slept worse the last month.    She started to note urinary urgency last week (aroud 01/19/2021) She notes the right vision is mildly blurry.   Color vision is symmetric, though.      She has hypertension and is on propranolol ad HCTZ.     She has had some boils that   MRI also showed an empty sella.    She has some headaches and soe visual changes but has not seen ophthalmology.   She sometimes feels lightheaded upon standing.     Imaging: MRI of the brain 01/12/2021 showed T2/flair hyperintense foci in the hemispheres, some of the periventricular white matter and 1 in the medial left parietal lobe in the juxtacortical white matter.  A focus in the right parietal centrum semiovale enhanced after contrast was administered consistent with an acute demyelinating plaque.  Additionally, a focus adjacent to the right trigone had subtle enhancement.  A nonenhancing focus was also seen in the right medulla.   She also has an enlarged sella turcica and flattened pituitary.    Optic nerve sheaths were normal.     MRI of the cervical spine 01/12/2021 showed a normal spinal cord.  The focus in the medulla lobe is redemonstrated.  Normal enhancement pattern.  Laboratory tests:: Beta hCG, CBC and  BMP were noncontributory 01/12/2021.  Vitamin B12 and TSH were normal 01/07/2021.  REVIEW OF SYSTEMS: Constitutional: No fevers, chills, sweats, or change in appetite Eyes: No visual changes, double vision, eye pain Ear, nose and throat: No hearing loss, ear pain, nasal congestion, sore throat Cardiovascular: No chest pain, palpitations Respiratory:  No shortness of breath at rest or with exertion.   No wheezes GastrointestinaI: No nausea, vomiting, diarrhea, abdominal pain, fecal incontinence Genitourinary:  No dysuria, urinary retention or frequency.  No nocturia. Musculoskeletal:  No neck pain, back pain Integumentary: No rash, pruritus, skin  lesions Neurological: as above Psychiatric: No depression at this time.  No anxiety Endocrine: No palpitations, diaphoresis, change in appetite, change in weigh or increased thirst Hematologic/Lymphatic:  No anemia, purpura, petechiae. Allergic/Immunologic: No itchy/runny eyes, nasal congestion, recent allergic reactions, rashes  ALLERGIES: No Known Allergies  HOME MEDICATIONS:  Current Outpatient Medications:    hydrochlorothiazide (MICROZIDE) 12.5 MG capsule, Take 12.5 mg by mouth daily., Disp: , Rfl:    propranolol (INDERAL) 40 MG tablet, Take 40 mg by mouth 2 (two) times daily., Disp: , Rfl:   PAST MEDICAL HISTORY: Past Medical History:  Diagnosis Date   Hypertension    Irregular periods/menstrual cycles    Multiple sclerosis (HCC) 01/12/2021    PAST SURGICAL HISTORY: History reviewed. No pertinent surgical history.  FAMILY HISTORY: Family History  Problem Relation Age of Onset   Hypertension Mother    Stroke Mother    Psoriasis Mother    Psoriasis Father    Crohn's disease Sister    Diabetes Maternal Aunt    Atrial fibrillation Maternal Aunt    Asthma Maternal Aunt    Cancer Maternal Grandmother     SOCIAL HISTORY:  Social History   Socioeconomic History   Marital status: Single    Spouse name: Not on file   Number of children: Not on file   Years of education: Not on file   Highest education level: Some college, no degree  Occupational History   Not on file  Tobacco Use   Smoking status: Former    Types: Cigarettes   Smokeless tobacco: Never  Vaping Use   Vaping Use: Never used  Substance and Sexual Activity   Alcohol use: No   Drug use: No   Sexual activity: Yes    Birth control/protection: None  Other Topics Concern   Not on file  Social History Narrative   Lives w boyfriend   Left handed   Caffeine: 2 cups of coffee in the AM   Social Determinants of Health   Financial Resource Strain: Not on file  Food Insecurity: Not on file   Transportation Needs: Not on file  Physical Activity: Not on file  Stress: Not on file  Social Connections: Not on file  Intimate Partner Violence: Not on file     PHYSICAL EXAM  Vitals:   01/25/21 0853  BP: (!) 135/92  Pulse: 89  Weight: (!) 319 lb (144.7 kg)  Height: 5\' 10"  (1.778 m)    Body mass index is 45.77 kg/m.  Vision Screening   Right eye Left eye Both eyes  Without correction 20/30 20/20 20/20   With correction        General: The patient is well-developed and well-nourished and in no acute distress  HEENT:  Head is [place]/AT.  Sclera are anicteric.  Funduscopic exam shows normal optic discs and retinal vessels.  Neck: No carotid bruits are noted.  The neck is nontender.  Cardiovascular: The  heart has a regular rate and rhythm with a normal S1 and S2. There were no murmurs, gallops or rubs.    Skin: Extremities are without rash or  edema.  Musculoskeletal:  Back is nontender  Neurologic Exam  Mental status: The patient is alert and oriented x 3 at the time of the examination. The patient has apparent normal recent and remote memory, with an apparently normal attention span and concentration ability.   Speech is normal.  Cranial nerves: Extraocular movements are full. Pupils are equal, round, and reactive to light and accomodation.  Visual fields are full.  Facial symmetry is present. There is good facial sensation to soft touch bilaterally.Facial strength is normal.  Trapezius and sternocleidomastoid strength is normal. No dysarthria is noted.  The tongue is midline, and the patient has symmetric elevation of the soft palate. No obvious hearing deficits are noted.  Motor:  Muscle bulk is normal.   Tone is normal. Strength is  5 / 5 in all 4 extremities.   Sensory: Sensory testing is intact to pinprick, soft touch and vibration sensation in all 4 extremities.  Coordination: Cerebellar testing reveals good finger-nose-finger and heel-to-shin  bilaterally.  Gait and station: Station is normal.   Gait is normal. Tandem gait is normal. Romberg is negative.   Reflexes: Deep tendon reflexes are symmetric and normal bilaterally.   Plantar responses are flexor.     DIAGNOSTIC DATA (LABS, IMAGING, TESTING) - I reviewed patient records, labs, notes, testing and imaging myself where available.  Lab Results  Component Value Date   WBC 12.3 (H) 01/12/2021   HGB 13.1 01/12/2021   HCT 39.3 01/12/2021   MCV 82.7 01/12/2021   PLT 375 01/12/2021      Component Value Date/Time   NA 136 01/12/2021 0422   K 3.9 01/12/2021 0422   CL 104 01/12/2021 0422   CO2 24 01/12/2021 0422   GLUCOSE 113 (H) 01/12/2021 0422   BUN 15 01/12/2021 0422   CREATININE 0.84 01/12/2021 0422   CALCIUM 8.4 (L) 01/12/2021 0422   GFRNONAA >60 01/12/2021 0422   Lab Results  Component Value Date   CHOL 129 06/24/2015   HDL 26 (L) 06/24/2015   LDLCALC 75 06/24/2015   TRIG 140 06/24/2015   CHOLHDL 5.0 06/24/2015   No results found for: HGBA1C No results found for: VITAMINB12 Lab Results  Component Value Date   TSH 1.536 06/24/2015       ASSESSMENT AND PLAN  Multiple sclerosis (HCC) - Plan: Other/Misc lab test, Hepatitis B core antibody, total, Hepatitis B surface antigen, HIV Antibody (routine testing w rflx), QuantiFERON-TB Gold Plus, Varicella zoster antibody, IgG, Hepatitis C antibody, Hepatitis B surface antibody,qualitative, VITAMIN D 25 Hydroxy (Vit-D Deficiency, Fractures), IgG, IgA, IgM, Stratify JCV Antibody Test ([facility]), CYP2C9 Genotyping Siponimod  High risk medication use - Plan: Other/Misc lab test, Hepatitis B core antibody, total, Hepatitis B surface antigen, HIV Antibody (routine testing w rflx), QuantiFERON-TB Gold Plus, Varicella zoster antibody, IgG, Hepatitis C antibody, Hepatitis B surface antibody,qualitative, VITAMIN D 25 Hydroxy (Vit-D Deficiency, Fractures), IgG, IgA, IgM, Stratify JCV Antibody Test ([facility]), CYP2C9 Genotyping  Siponimod   In summary, Ms. Lippold is a 30 year old woman who presented with left-sided numbness several weeks ago and was found to have MRI of the brain consistent with multiple sclerosis.  It is probable that the lesion in the corona radiata on the right course her symptoms.  Fortunately she has made a complete recovery and her exam is normal at this  time.  We went over the need for treating her MS.  Because she does have a brainstem lesion it is possible that she could have a more aggressive course and I recommend that she initiate either Ocrevus or Tysabri.  We will check some blood work and based on the JCV antibody and other results 1 of these medications will be initiated.  The MRI of the brain does show an empty sella.  Although she has headaches, there is no evidence of papilledema..  Therefore, she probably does not have idiopathic intracranial hypertension.  I have advised her to still get an evaluation with ophthalmology since she has reported some visual problems.  She is advised to stay active and exercise as tolerated.  Try to lose weight.  She will return to see me in 3 months for regular scheduled visit or sooner if there are new or worsening neurologic symptoms.  Thank you for asking me [facility].  Please let me know if I can be of further assistance with her or other patients in the future.   Kaci Dillie A. Epimenio FootSater, MD, PhD, FAAN Certified in Neurology, Clinical Neurophysiology, Sleep Medicine, Pain Medicine and Neuroimaging Director, [facility]  [facility] [address], [address] [place], [place] [address] [phone]

## 2021-01-25 NOTE — Telephone Encounter (Signed)
Placed JCV lab in [facility] lock box for routine lab pick up. Results pending. 

## 2021-01-30 LAB — QUANTIFERON-TB GOLD PLUS
QuantiFERON Mitogen Value: >10 IU/mL
QuantiFERON Nil Value: 0.05 IU/mL
QuantiFERON TB1 Ag Value: 0.3 IU/mL
QuantiFERON TB2 Ag Value: 0.4 IU/mL
QuantiFERON-TB Gold Plus: POSITIVE — AB

## 2021-01-30 LAB — IGG, IGA, IGM
IgA/Immunoglobulin A, Serum: 117 mg/dL (ref 87–352)
IgG (Immunoglobin G), Serum: 1307 mg/dL (ref 586–1602)
IgM (Immunoglobulin M), Srm: 82 mg/dL (ref 26–217)

## 2021-01-30 LAB — VITAMIN D 25 HYDROXY (VIT D DEFICIENCY, FRACTURES): Vit D, 25-Hydroxy: 13.1 ng/mL — ABNORMAL LOW (ref 30.0–100.0)

## 2021-01-30 LAB — HEPATITIS C ANTIBODY: Hep C Virus Ab: <0.1 s/co ratio (ref 0.0–0.9)

## 2021-01-30 LAB — VARICELLA ZOSTER ANTIBODY, IGG: Varicella zoster IgG: 714 index (ref >165)

## 2021-01-30 LAB — HEPATITIS B CORE ANTIBODY, TOTAL: Hep B Core Total Ab: NEGATIVE

## 2021-01-30 LAB — HEPATITIS B SURFACE ANTIGEN: Hepatitis B Surface Ag: NEGATIVE

## 2021-01-30 LAB — HEPATITIS B SURFACE ANTIBODY,QUALITATIVE: Hep B Surface Ab, Qual: REACTIVE

## 2021-01-30 LAB — HIV ANTIBODY (ROUTINE TESTING W REFLEX): HIV Screen 4th Generation wRfx: NONREACTIVE

## 2021-02-02 LAB — CYP2C9 GENOTYPING SIPONIMOD

## 2021-02-02 NOTE — Telephone Encounter (Signed)
JCV ab drawn on 01/25/21 negative, index: 0.15

## 2021-02-07 ENCOUNTER — Telehealth: Payer: Self-pay | Admitting: Neurology

## 2021-02-07 DIAGNOSIS — G35 Multiple sclerosis: Secondary | ICD-10-CM

## 2021-02-07 DIAGNOSIS — Z79899 Other long term (current) drug therapy: Secondary | ICD-10-CM

## 2021-02-07 DIAGNOSIS — R7612 Nonspecific reaction to cell mediated immunity measurement of gamma interferon antigen response without active tuberculosis: Secondary | ICD-10-CM

## 2021-02-07 MED ORDER — VITAMIN D (ERGOCALCIFEROL) 1.25 MG (50000 UNIT) PO CAPS: 50000.0000 [IU] | ORAL_CAPSULE | ORAL | 1 refills | Status: DC

## 2021-02-07 NOTE — Telephone Encounter (Signed)
Her Jodi Castillo TB test came back positive.  I discussed with her that this is most likely a false positive (she has no TB exposure or exposure to populations at risk)  We will recheck a QuantiFERON TB test and I will also have her do a chest x-ray.  She lives close to the [facility] emergency room.  If either  test is abnormal I will want her to see infectious disease.  Because it may take a little time for these results we will go ahead and get her started on Tysabri.  She is JCV antibody negative.  Additionally, vitamin D was low and I will have her supplement this.

## 2021-02-09 NOTE — Telephone Encounter (Signed)
Tysabri start form faxed to [employer].  Received a receipt of confirmation.  Dr. Epimenio Foot signed Tysabri order.  I called patient to discuss her vitamin D and obtaining the chest x-ray.  I also wanted to discuss with her that we have sent the infusion order for Tysabri to our infusion suite.  They will be reaching out to her when they obtain insurance authorization.  No answer, left a voicemail asking her to call me back.

## 2021-02-10 NOTE — Telephone Encounter (Signed)
Pt returned phone call, would like a call back.  

## 2021-02-10 NOTE — Telephone Encounter (Signed)
Called the pt back. She had already gotten a message from Dr Epimenio Foot advising her of the xray being ordered and the vitamin d level. Informed her that we did send the order to infusion suite for them to get tysabri going. Advised that once insurance Berkley Harvey is obtained they will be in contact to get her scheduled. Pt verbalized understanding. Pt had no questions at this time but was encouraged to call back if questions arise.

## 2021-02-17 ENCOUNTER — Ambulatory Visit (HOSPITAL_BASED_OUTPATIENT_CLINIC_OR_DEPARTMENT_OTHER)
Admission: RE | Admit: 2021-02-17 | Discharge: 2021-02-17 | Disposition: A | Discharge status: Home / Self Care | Payer: BC Managed Care – PPO | Source: Ambulatory Visit | Attending: Neurology | Admitting: Neurology

## 2021-02-17 ENCOUNTER — Other Ambulatory Visit: Payer: Self-pay | Admitting: *Deleted

## 2021-02-17 ENCOUNTER — Other Ambulatory Visit: Payer: Self-pay

## 2021-02-17 DIAGNOSIS — R899 Unspecified abnormal finding in specimens from other organs, systems and tissues: Secondary | ICD-10-CM

## 2021-02-17 DIAGNOSIS — G35 Multiple sclerosis: Secondary | ICD-10-CM | POA: Insufficient documentation

## 2021-02-17 DIAGNOSIS — Z79899 Other long term (current) drug therapy: Secondary | ICD-10-CM | POA: Insufficient documentation

## 2021-02-17 DIAGNOSIS — R7612 Nonspecific reaction to cell mediated immunity measurement of gamma interferon antigen response without active tuberculosis: Secondary | ICD-10-CM | POA: Diagnosis not present

## 2021-02-17 DIAGNOSIS — R7611 Nonspecific reaction to tuberculin skin test without active tuberculosis: Secondary | ICD-10-CM | POA: Diagnosis not present

## 2021-02-21 LAB — QUANTIFERON-TB GOLD PLUS
QuantiFERON Mitogen Value: >10 IU/mL
QuantiFERON Nil Value: 0.09 IU/mL
QuantiFERON TB1 Ag Value: 0.47 IU/mL
QuantiFERON TB2 Ag Value: 0.47 IU/mL
QuantiFERON-TB Gold Plus: POSITIVE — AB

## 2021-02-22 ENCOUNTER — Telehealth: Payer: Self-pay | Admitting: Neurology

## 2021-02-22 ENCOUNTER — Other Ambulatory Visit: Payer: Self-pay | Admitting: Neurology

## 2021-02-22 DIAGNOSIS — R7612 Nonspecific reaction to cell mediated immunity measurement of gamma interferon antigen response without active tuberculosis: Secondary | ICD-10-CM

## 2021-02-22 DIAGNOSIS — Z79899 Other long term (current) drug therapy: Secondary | ICD-10-CM

## 2021-02-22 DIAGNOSIS — G35 Multiple sclerosis: Secondary | ICD-10-CM

## 2021-02-22 NOTE — Telephone Encounter (Signed)
-----   Message from Asa Lente, MD sent at 02/22/2021  8:56 AM EDT ----- The repeat TB test was also positive.   The X-ray was normal.   I would like her  to see the Infectious Disease office to get an opinion from them whether she needs a treatment course  I will send in the referral

## 2021-02-22 NOTE — Telephone Encounter (Signed)
Called the pt and advised of the TB test being positive. Advised the chest xray was normal. Informed the next steps would be a referral placed to ID to decide if treatment is necessary. Pt verbalized understanding. Pt had no questions at this time but was encouraged to call back if questions arise.

## 2021-03-03 ENCOUNTER — Ambulatory Visit: Payer: BC Managed Care – PPO | Admitting: Internal Medicine

## 2021-03-09 ENCOUNTER — Encounter: Payer: Self-pay | Admitting: Internal Medicine

## 2021-03-09 ENCOUNTER — Other Ambulatory Visit: Payer: Self-pay

## 2021-03-09 ENCOUNTER — Ambulatory Visit: Payer: BC Managed Care – PPO | Admitting: Internal Medicine

## 2021-03-09 DIAGNOSIS — Z227 Latent tuberculosis: Secondary | ICD-10-CM | POA: Diagnosis not present

## 2021-03-09 MED ORDER — RIFAMPIN 300 MG PO CAPS: 600.0000 mg | ORAL_CAPSULE | Freq: Every day | ORAL | 3 refills | Status: DC

## 2021-03-09 NOTE — Progress Notes (Signed)
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 Linden Dolin    Patient ID: Jodi Castillo, female    DOB: [birth year], 30 y.o.   MRN: [id number]  HPI:   She is here for evaluation of a positive Quantiferon Gold test x 2.   She has a recent diagnosis of multiple sclerosis and plan for treatment with Tysabri and pretreatment work up included a positive Quantiferon Gold test.  She had this repeated at an urgent care as well and again positive.  She has no history of foreign travel to developing countries, no known exposure to Tb, no history of jail time.  She is asymptomatic with no weight loss, no night sweats, no cough, no fever.  She had a recent CXR and negative for any acitve infection.  She had a recent negative HIV test.  She is here today with her mother.  She is going to start Tysabri on 03/14/21.    Past Medical History:  Diagnosis Date   Hypertension    Irregular periods/menstrual cycles    Multiple sclerosis (HCC) 01/12/2021    Prior to Admission medications   Medication Sig Start Date End Date Taking? Authorizing Provider  hydrochlorothiazide (MICROZIDE) 12.5 MG capsule Take 12.5 mg by mouth daily. 12/22/20  Yes [provider]  propranolol (INDERAL) 40 MG tablet Take 40 mg by mouth 2 (two) times daily. 11/13/20  Yes [provider]  rifampin (RIFADIN) 300 MG capsule Take 2 capsules (600 mg total) by mouth daily. 03/09/21  Yes Siris Hoos, Belia Heman, MD  Vitamin D, Ergocalciferol, (DRISDOL) 1.25 MG (50000 UNIT) CAPS capsule Take 1 capsule (50,000 Units total) by mouth every 7 (seven) days. 02/07/21  Yes Sater, Pearletha Furl, MD    No Known Allergies  Social History   Tobacco Use   Smoking status: Former    Types: Cigarettes   Smokeless tobacco: Never  Vaping Use   Vaping Use: Never used  Substance Use Topics   Alcohol use: No   Drug use: No    Family History  Problem Relation Age of  Onset   Hypertension Mother    Stroke Mother    Psoriasis Mother    Psoriasis Father    Crohn's disease Sister    Diabetes Maternal Aunt    Atrial fibrillation Maternal Aunt    Asthma Maternal Aunt    Cancer Maternal Grandmother     Review of Systems  Constitutional: negative for fevers, chills, sweats, and fatigue Respiratory: negative for cough, sputum, or hemoptysis Gastrointestinal: negative for nausea and diarrhea Integument/breast: negative for rash All other systems reviewed and are negative    Constitutional: in no apparent distress  Vitals:   03/09/21 1354  BP: 133/86  Pulse: 81  Resp: 16  SpO2: 98%   EYES: anicteric ENMT:no thrush Cardiovascular: Cor RRR Respiratory: clear GI: Bowel sounds are normal, liver is not enlarged, spleen is not enlarged Musculoskeletal: no pedal edema noted Skin: negatives: no rash Neuro: non-focal  Labs: Lab Results  Component Value Date   WBC 12.3 (H) 01/12/2021   HGB 13.1 01/12/2021   HCT 39.3 01/12/2021   MCV 82.7 01/12/2021   PLT 375 01/12/2021    Lab Results  Component Value Date   CREATININE 0.84 01/12/2021   BUN 15 01/12/2021   NA 136 01/12/2021   K 3.9 01/12/2021   CL 104 01/12/2021   CO2 24 01/12/2021   No  results found for: ALT, AST, GGT, ALKPHOS, BILITOT, INR   Assessment: latent Tb.  Negative CXR, negative recent HIV.  I discussed latent tb and reason for testing prior to initiation of immunosuppressive medications and indication for treatment.  Will initiate rifampin 600 mg daily for 4 months.  I discussed monitoring of her liver.  Quantiferon Gold test will remain positive.    Plan: 1)  rifampin 600 mg daily for 4 months 2) hepatic function panel today 3) return in 4 weeks for repeat hepatic function panel  4) rifampin side effects discussed; no interactions with current medications or  tysbari

## 2021-03-10 LAB — HEPATIC FUNCTION PANEL
AG Ratio: 1.4 (calc) (ref 1.0–2.5)
ALT: 13 U/L (ref 6–29)
AST: 15 U/L (ref 10–30)
Albumin: 4.2 g/dL (ref 3.6–5.1)
Alkaline phosphatase (APISO): 47 U/L (ref 31–125)
Bilirubin, Direct: 0.1 mg/dL (ref 0.0–0.2)
Globulin: 2.9 g/dL (calc) (ref 1.9–3.7)
Indirect Bilirubin: 0.2 mg/dL (calc) (ref 0.2–1.2)
Total Bilirubin: 0.3 mg/dL (ref 0.2–1.2)
Total Protein: 7.1 g/dL (ref 6.1–8.1)

## 2021-03-14 DIAGNOSIS — G35 Multiple sclerosis: Secondary | ICD-10-CM | POA: Diagnosis not present

## 2021-03-22 NOTE — Telephone Encounter (Signed)
Patient's first tysabri infusion was 03/14/2021.

## 2021-04-06 ENCOUNTER — Encounter: Payer: Self-pay | Admitting: Internal Medicine

## 2021-04-06 ENCOUNTER — Ambulatory Visit: Payer: BC Managed Care – PPO | Admitting: Internal Medicine

## 2021-04-06 ENCOUNTER — Other Ambulatory Visit: Payer: Self-pay

## 2021-04-06 VITALS — BP 162/106 | HR 86 | Temp 97.3°F | Wt 328.0 lb

## 2021-04-06 DIAGNOSIS — G35 Multiple sclerosis: Secondary | ICD-10-CM

## 2021-04-06 DIAGNOSIS — Z5181 Encounter for therapeutic drug level monitoring: Secondary | ICD-10-CM | POA: Insufficient documentation

## 2021-04-06 DIAGNOSIS — Z227 Latent tuberculosis: Secondary | ICD-10-CM | POA: Diagnosis not present

## 2021-04-06 NOTE — Assessment & Plan Note (Signed)
No issues on rifampin and tolerating well.  Plan to continue for 4 months total and stop.  She will return in about 2 months to recheck.

## 2021-04-06 NOTE — Progress Notes (Signed)
   Subjective:    Patient ID: Jodi Castillo, female    DOB: 1991-05-08, 30 y.o.   MRN: [id number]  HPI Here for follow up of latent Tb She started rifampin after her first visit about 4 weeks ago.  No significant issues with tolerance.  No new cough, fever or any concerns.    Review of Systems  Constitutional:  Negative for chills, fatigue and fever.  Respiratory:  Negative for cough and shortness of breath.       Objective:   Physical Exam Eyes:     General: No scleral icterus. Pulmonary:     Effort: Pulmonary effort is normal. No respiratory distress.  Neurological:     General: No focal deficit present.     Mental Status: She is alert.  Psychiatric:        Mood and Affect: Mood normal.   SH: no tobacco       Assessment & Plan:

## 2021-04-06 NOTE — Assessment & Plan Note (Signed)
Ok to start any MS medication from ID standpoint as indicated by Dr. Linden Dolin.

## 2021-04-06 NOTE — Assessment & Plan Note (Signed)
Will check a hepatic panel on rifampin and repeat at her next visit in 2 months

## 2021-04-07 LAB — HEPATIC FUNCTION PANEL
AG Ratio: 1.6 (calc) (ref 1.0–2.5)
ALT: 14 U/L (ref 6–29)
AST: 12 U/L (ref 10–30)
Albumin: 4.2 g/dL (ref 3.6–5.1)
Alkaline phosphatase (APISO): 47 U/L (ref 31–125)
Bilirubin, Direct: 0.1 mg/dL (ref 0.0–0.2)
Globulin: 2.7 g/dL (calc) (ref 1.9–3.7)
Indirect Bilirubin: 0.2 mg/dL (calc) (ref 0.2–1.2)
Total Bilirubin: 0.3 mg/dL (ref 0.2–1.2)
Total Protein: 6.9 g/dL (ref 6.1–8.1)

## 2021-04-20 DIAGNOSIS — G35 Multiple sclerosis: Secondary | ICD-10-CM | POA: Diagnosis not present

## 2021-04-28 DIAGNOSIS — G35 Multiple sclerosis: Secondary | ICD-10-CM | POA: Diagnosis not present

## 2021-04-28 DIAGNOSIS — Z0000 Encounter for general adult medical examination without abnormal findings: Secondary | ICD-10-CM | POA: Diagnosis not present

## 2021-04-28 DIAGNOSIS — Z227 Latent tuberculosis: Secondary | ICD-10-CM | POA: Diagnosis not present

## 2021-05-18 DIAGNOSIS — G35 Multiple sclerosis: Secondary | ICD-10-CM | POA: Diagnosis not present

## 2021-06-07 ENCOUNTER — Ambulatory Visit: Payer: BC Managed Care – PPO | Admitting: Internal Medicine

## 2021-06-20 DIAGNOSIS — G35 Multiple sclerosis: Secondary | ICD-10-CM | POA: Diagnosis not present

## 2021-07-18 DIAGNOSIS — G35 Multiple sclerosis: Secondary | ICD-10-CM | POA: Diagnosis not present

## 2021-08-15 ENCOUNTER — Other Ambulatory Visit: Payer: Self-pay | Admitting: Neurology

## 2021-08-15 ENCOUNTER — Telehealth: Payer: Self-pay | Admitting: *Deleted

## 2021-08-15 NOTE — Telephone Encounter (Signed)
LVM for pt advising she is past due for a f/u and offering 08/18/21 at 1130am with Dr. Epimenio Foot. Asked her to call back tomorrow morning to let us know if this appt works. ?

## 2021-08-16 NOTE — Telephone Encounter (Signed)
Called and spoke with pt. She accepted appt 08/18/21 at 1130am with Dr. Epimenio Foot. Asked she check in at 11am, bring updated insurance cards and med list to appt. She verbalized understanding.  ?She has infusion scheduled for tomorrow. ?

## 2021-08-17 ENCOUNTER — Telehealth: Payer: Self-pay | Admitting: Neurology

## 2021-08-17 ENCOUNTER — Other Ambulatory Visit: Payer: Self-pay | Admitting: *Deleted

## 2021-08-17 ENCOUNTER — Other Ambulatory Visit (INDEPENDENT_AMBULATORY_CARE_PROVIDER_SITE_OTHER): Payer: Self-pay

## 2021-08-17 DIAGNOSIS — Z79899 Other long term (current) drug therapy: Secondary | ICD-10-CM

## 2021-08-17 DIAGNOSIS — G35 Multiple sclerosis: Secondary | ICD-10-CM

## 2021-08-17 DIAGNOSIS — Z0289 Encounter for other administrative examinations: Secondary | ICD-10-CM

## 2021-08-17 NOTE — Telephone Encounter (Signed)
Placed JCV lab in [facility] lock box for routine lab pick up. Results pending. 

## 2021-08-18 ENCOUNTER — Ambulatory Visit: Payer: BC Managed Care – PPO | Admitting: Neurology

## 2021-08-18 LAB — CBC WITH DIFFERENTIAL/PLATELET
Basophils Absolute: 0.1 10*3/uL (ref 0.0–0.2)
Basos: 1 %
EOS (ABSOLUTE): 0.5 10*3/uL — ABNORMAL HIGH (ref 0.0–0.4)
Eos: 4 %
Hematocrit: 41.7 % (ref 34.0–46.6)
Hemoglobin: 13.5 g/dL (ref 11.1–15.9)
Immature Grans (Abs): 0.1 10*3/uL (ref 0.0–0.1)
Immature Granulocytes: 1 %
Lymphocytes Absolute: 4.7 10*3/uL — ABNORMAL HIGH (ref 0.7–3.1)
Lymphs: 42 %
MCH: 26.7 pg (ref 26.6–33.0)
MCHC: 32.4 g/dL (ref 31.5–35.7)
MCV: 83 fL (ref 79–97)
Monocytes Absolute: 1 10*3/uL — ABNORMAL HIGH (ref 0.1–0.9)
Monocytes: 9 %
Neutrophils Absolute: 4.8 10*3/uL (ref 1.4–7.0)
Neutrophils: 43 %
Platelets: 331 10*3/uL (ref 150–450)
RBC: 5.05 x10E6/uL (ref 3.77–5.28)
RDW: 13.3 % (ref 11.7–15.4)
WBC: 11.1 10*3/uL — ABNORMAL HIGH (ref 3.4–10.8)

## 2021-08-24 NOTE — Telephone Encounter (Signed)
Received the result for the JCV antibody, it was negative and the index value 0.14. ?

## 2021-09-14 DIAGNOSIS — G35 Multiple sclerosis: Secondary | ICD-10-CM | POA: Diagnosis not present

## 2021-10-02 ENCOUNTER — Other Ambulatory Visit: Payer: Self-pay | Admitting: Neurology

## 2021-10-12 DIAGNOSIS — G35 Multiple sclerosis: Secondary | ICD-10-CM | POA: Diagnosis not present

## 2021-11-10 ENCOUNTER — Other Ambulatory Visit (HOSPITAL_COMMUNITY)
Admission: RE | Admit: 2021-11-10 | Discharge: 2021-11-10 | Disposition: A | Discharge status: Home / Self Care | Payer: BC Managed Care – PPO | Source: Ambulatory Visit | Attending: Nurse Practitioner | Admitting: Nurse Practitioner

## 2021-11-10 ENCOUNTER — Ambulatory Visit: Payer: BC Managed Care – PPO | Admitting: Nurse Practitioner

## 2021-11-10 ENCOUNTER — Encounter: Payer: Self-pay | Admitting: Nurse Practitioner

## 2021-11-10 VITALS — BP 124/80 | Ht 70.0 in | Wt 287.0 lb

## 2021-11-10 DIAGNOSIS — N760 Acute vaginitis: Secondary | ICD-10-CM | POA: Diagnosis not present

## 2021-11-10 DIAGNOSIS — Z113 Encounter for screening for infections with a predominantly sexual mode of transmission: Secondary | ICD-10-CM | POA: Insufficient documentation

## 2021-11-10 DIAGNOSIS — Z01419 Encounter for gynecological examination (general) (routine) without abnormal findings: Secondary | ICD-10-CM | POA: Diagnosis not present

## 2021-11-10 DIAGNOSIS — N898 Other specified noninflammatory disorders of vagina: Secondary | ICD-10-CM | POA: Diagnosis not present

## 2021-11-10 DIAGNOSIS — B9689 Other specified bacterial agents as the cause of diseases classified elsewhere: Secondary | ICD-10-CM

## 2021-11-10 DIAGNOSIS — Z30011 Encounter for initial prescription of contraceptive pills: Secondary | ICD-10-CM

## 2021-11-10 LAB — WET PREP FOR TRICH, YEAST, CLUE

## 2021-11-10 MED ORDER — METRONIDAZOLE 500 MG PO TABS: 500.0000 mg | ORAL_TABLET | Freq: Two times a day (BID) | ORAL | 0 refills | Status: DC

## 2021-11-10 MED ORDER — NORETHIN ACE-ETH ESTRAD-FE 1-20 MG-MCG PO TABS: 1.0000 | ORAL_TABLET | Freq: Every day | ORAL | 3 refills | Status: DC

## 2021-11-10 NOTE — Progress Notes (Signed)
   Jodi Castillo 08-Aug-1990 [id number]   History:  31 y.o. G0 presents to reestablish care. Monthly cycles. Normal pap history. H/O MS, HTN. Complains of vaginal itching that started a couple of weeks ago after using Lume products. Would like to restart COCs.   Gynecologic History Patient's last menstrual period was 10/16/2021. Period Cycle (Days): 28 Period Duration (Days): 5 Period Pattern: Regular Menstrual Flow: Heavy, Moderate Dysmenorrhea: (!) Mild Dysmenorrhea Symptoms: Cramping Contraception/Family planning: coitus interruptus Sexually active: Yes  Health Maintenance Last Pap: 06/24/2015. Results were: Normal Last mammogram: Not indicated Last colonoscopy: Not indicated Last Dexa: Not indicated  Past medical history, past surgical history, family history and social history were all reviewed and documented in the EPIC chart. Boyfriend of 8 years. Works for [employer].   ROS:  A ROS was performed and pertinent positives and negatives are included.  Exam:  Vitals:   11/10/21 0948  BP: 124/80  Weight: 287 lb (130.2 kg)  Height: 5\' 10"  (1.778 m)   Body mass index is 41.18 kg/m.  General appearance:  Normal Thyroid:  Symmetrical, normal in size, without palpable masses or nodularity. Respiratory  Auscultation:  Clear without wheezing or rhonchi Cardiovascular  Auscultation:  Regular rate, without rubs, murmurs or gallops  Edema/varicosities:  Not grossly evident Abdominal  Soft,nontender, without masses, guarding or rebound.  Liver/spleen:  No organomegaly noted  Hernia:  None appreciated  Skin  Inspection:  Grossly normal Breasts: Examined lying and sitting.   Right: Without masses, retractions, nipple discharge or axillary adenopathy.   Left: Without masses, retractions, nipple discharge or axillary adenopathy. Genitourinary   Inguinal/mons:  Normal without inguinal adenopathy  External genitalia:  Normal appearing vulva with no masses, tenderness, or  lesions  BUS/Urethra/Skene's glands:  Normal  Vagina:  Normal appearing with normal color and discharge, no lesions  Cervix:  Normal appearing without discharge or lesions  Uterus:  Difficult to palpate due to body habitus but no gross masses or tenderness  Adnexa/parametria:     Rt: Normal in size, without masses or tenderness.   Lt: Normal in size, without masses or tenderness.  Anus and perineum: Normal  Digital rectal exam: Not indicated  Patient informed chaperone available to be present for breast and pelvic exam. Patient has requested no chaperone to be present. Patient has been advised what will be completed during breast and pelvic exam.   Wet prep + clue cells (+ odor)  Assessment/Plan:  31 y.o. G0 to reestablish care.   Well female exam with routine gynecological exam - Plan: Cytology - PAP( [facility]). Education provided on SBEs, importance of preventative screenings, current guidelines, high calcium diet, regular exercise, and multivitamin daily.  Labs with PCP.   Bacterial vaginosis - Plan: metroNIDAZOLE (FLAGYL) 500 MG tablet BID x 7 days.   Encounter for initial prescription of contraceptive pills - Plan: norethindrone-ethinyl estradiol-FE (JUNEL FE 1/20) 1-20 MG-MCG tablet daily. Has been on COCs in the past and wants to restart. She will monitor BP, which she does already. Will start with next menses.   Vagina itching - Plan: WET PREP FOR TRICH, YEAST, CLUE. Wet prep positive for clue cells.   Screen for STD (sexually transmitted disease) - Plan: RPR, HIV Antibody (routine testing w rflx), Cytology - PAP( [facility]). GC/chlamydia added to pap.   Screening for cervical cancer - Normal Pap history.  Pap today.   Return in 1 year for annual.

## 2021-11-11 LAB — CYTOLOGY - PAP
Chlamydia: NEGATIVE
Comment: NEGATIVE
Comment: NEGATIVE
Comment: NEGATIVE
Comment: NORMAL
Diagnosis: NEGATIVE
High risk HPV: NEGATIVE
Neisseria Gonorrhea: NEGATIVE
Trichomonas: NEGATIVE

## 2021-11-11 LAB — HIV ANTIBODY (ROUTINE TESTING W REFLEX): HIV 1&2 Ab, 4th Generation: NONREACTIVE

## 2021-11-11 LAB — RPR: RPR Ser Ql: NONREACTIVE

## 2021-11-14 DIAGNOSIS — G35 Multiple sclerosis: Secondary | ICD-10-CM | POA: Diagnosis not present

## 2021-11-23 ENCOUNTER — Ambulatory Visit: Payer: BC Managed Care – PPO | Admitting: Neurology

## 2021-11-23 ENCOUNTER — Encounter: Payer: Self-pay | Admitting: Neurology

## 2021-11-23 VITALS — BP 136/91 | HR 71 | Ht 70.0 in | Wt 293.0 lb

## 2021-11-23 DIAGNOSIS — R200 Anesthesia of skin: Secondary | ICD-10-CM

## 2021-11-23 DIAGNOSIS — G35 Multiple sclerosis: Secondary | ICD-10-CM | POA: Diagnosis not present

## 2021-11-23 DIAGNOSIS — Z79899 Other long term (current) drug therapy: Secondary | ICD-10-CM

## 2021-11-23 DIAGNOSIS — E236 Other disorders of pituitary gland: Secondary | ICD-10-CM

## 2021-11-23 NOTE — Progress Notes (Signed)
[facility]  PATIENT: Jodi Castillo DOB: [birth year]  REFERRING DOCTOR OR PCP:  Maud Deed, PA-C SOURCE: Patient, notes from ED/hospital, imaging and lab reports reviewed, MRI images personally reviewed.  _________________________________   HISTORICAL  CHIEF COMPLAINT:  Chief Complaint  Patient presents with   Follow-up    Pt alone, rm 1, MS follow up. DMT Tysabri- infused at [facility]. Last infusion was 11/14/21 and next is 12/14/2021. Last JCV 08/17/21 negative 0.14    HISTORY OF PRESENT ILLNESS:  Jodi Castillo is a 31 y.o. woman with  MS.    UPDATE 11/23/2021: She started Tysabri in [place] 2022 and has tolerated it well.  .  She has no side effects and feels good.  She denies any new neurologic symptoms.   No exacerbations.  She has no difficulties with gait, balance, strength or numbness.  There is very slight visual change on the right.  Colors are symmetric.  She has mild urinary urgency.  She denies much fatigue.  She usually sleeps well.  No difficulties with cognition or mood.      She has hypertension and is on propranolol ad HCTZ.     She has had some boils that   MRI also showed an empty sella.    She denies headaches and no major visual change.   She pans on seeing ophthalmology soon  .  No diplopia.   She does not feels lightheaded upon standing.      MS History: She is a 31 year old woman who had the onset of left foot and leg numbness around 01/05/2021 that increased and involved her left arm over the next 2 days.   She had fairly mild left facial numbness.   She did not note any definite weakness but she noted that her left leg gave out ad she stumbled a few times.     She went to Urgent Care because she though she had a pinched nerve and was prescribed a standard steroid pack.   A couple days later, as symptoms worsened, she also noted some leg pain, and she went to the ED.   She had brain and cervical spine imaging showing changes c/w MS.    Two white matter lesions enhanced after contrast c/w acute demyelinating lesions.   There were no lesions in the spinal cord though 1 is seen in the medulla.  She received IV steroids x 1 gram once and then was discharged on 625 mg prednisone x 2 more days.      Numbness resolved over the next few days and she currently feels back to baseline.  Imaging: MRI of the brain 01/12/2021 showed T2/flair hyperintense foci in the hemispheres, some of the periventricular white matter and 1 in the medial left parietal lobe in the juxtacortical white matter.  A focus in the right parietal centrum semiovale enhanced after contrast was administered consistent with an acute demyelinating plaque.  Additionally, a focus adjacent to the right trigone had subtle enhancement.  A nonenhancing focus was also seen in the right medulla.   She also has an enlarged sella turcica and flattened pituitary.    Optic nerve sheaths were normal.     MRI of the cervical spine 01/12/2021 showed a normal spinal cord.  The focus in the medulla lobe is redemonstrated.  Normal enhancement pattern.  Laboratory tests:: Beta hCG, CBC and BMP were noncontributory 01/12/2021.  Vitamin B12 and TSH were normal 01/07/2021.  REVIEW OF SYSTEMS: Constitutional: No fevers, chills, sweats,  or change in appetite Eyes: No visual changes, double vision, eye pain Ear, nose and throat: No hearing loss, ear pain, nasal congestion, sore throat Cardiovascular: No chest pain, palpitations Respiratory:  No shortness of breath at rest or with exertion.   No wheezes GastrointestinaI: No nausea, vomiting, diarrhea, abdominal pain, fecal incontinence Genitourinary:  No dysuria, urinary retention or frequency.  No nocturia. Musculoskeletal:  No neck pain, back pain Integumentary: No rash, pruritus, skin lesions Neurological: as above Psychiatric: No depression at this time.  No anxiety Endocrine: No palpitations, diaphoresis, change in appetite, change in weigh  or increased thirst Hematologic/Lymphatic:  No anemia, purpura, petechiae. Allergic/Immunologic: No itchy/runny eyes, nasal congestion, recent allergic reactions, rashes  ALLERGIES: No Known Allergies  HOME MEDICATIONS:  Current Outpatient Medications:    hydrochlorothiazide (MICROZIDE) 12.5 MG capsule, Take 12.5 mg by mouth daily., Disp: , Rfl:    natalizumab (TYSABRI) 300 MG/15ML injection, Inject into the vein., Disp: , Rfl:    propranolol (INDERAL) 40 MG tablet, Take 40 mg by mouth 2 (two) times daily., Disp: , Rfl:    norethindrone-ethinyl estradiol-FE (JUNEL FE 1/20) 1-20 MG-MCG tablet, Take 1 tablet by mouth daily. (Patient not taking: Reported on 11/23/2021), Disp: 84 tablet, Rfl: 3  PAST MEDICAL HISTORY: Past Medical History:  Diagnosis Date   Hypertension    Irregular periods/menstrual cycles    Multiple sclerosis (HCC) 01/12/2021    PAST SURGICAL HISTORY: History reviewed. No pertinent surgical history.  FAMILY HISTORY: Family History  Problem Relation Age of Onset   Hypertension Mother    Stroke Mother    Psoriasis Mother    Crohn's disease Sister    Diabetes Maternal Aunt    Atrial fibrillation Maternal Aunt    Asthma Maternal Aunt    Cancer Maternal Grandmother        Pancreatic    SOCIAL HISTORY:  Social History   Socioeconomic History   Marital status: Single    Spouse name: Not on file   Number of children: Not on file   Years of education: Not on file   Highest education level: Some college, no degree  Occupational History   Not on file  Tobacco Use   Smoking status: Former    Types: Cigarettes   Smokeless tobacco: Never  Vaping Use   Vaping Use: Never used  Substance and Sexual Activity   Alcohol use: Yes    Comment: Occas   Drug use: No   Sexual activity: Yes    Birth control/protection: None  Other Topics Concern   Not on file  Social History Narrative   Lives w boyfriend   Left handed   Caffeine: 2 cups of coffee in the AM    Social Determinants of Health   Financial Resource Strain: Not on file  Food Insecurity: Not on file  Transportation Needs: Not on file  Physical Activity: Not on file  Stress: Not on file  Social Connections: Not on file  Intimate Partner Violence: Not on file     PHYSICAL EXAM  Vitals:   11/23/21 1048  BP: (!) 136/91  Pulse: 71  Weight: 293 lb (132.9 kg)  Height: 5\' 10"  (1.778 m)    Body mass index is 42.04 kg/m.  No results found.    General: The patient is well-developed and well-nourished and in no acute distress  HEENT:  Head is [place]/AT.  Sclera are anicteric.  Funduscopic exam shows normal optic discs and retinal vessels.  Neck: Good range of motion  Skin:  Extremities are without rash or  edema.  Musculoskeletal:  Back is nontender  Neurologic Exam  Mental status: The patient is alert and oriented x 3 at the time of the examination. The patient has apparent normal recent and remote memory, with an apparently normal attention span and concentration ability.   Speech is normal.  Cranial nerves: Extraocular movements are full.  Facial strength and sensation was normal.. No obvious hearing deficits are noted.  Motor:  Muscle bulk is normal.   Tone is normal. Strength is  5 / 5 in all 4 extremities.   Sensory: Sensory testing is intact to pinprick, soft touch and vibration sensation in all 4 extremities.  Coordination: Cerebellar testing reveals good finger-nose-finger and heel-to-shin bilaterally.  Gait and station: Station is normal.   Gait is normal. Tandem gait is normal. Romberg is negative.   Reflexes: Deep tendon reflexes are symmetric and normal bilaterally.        DIAGNOSTIC DATA (LABS, IMAGING, TESTING) - I reviewed patient records, labs, notes, testing and imaging myself where available.  Lab Results  Component Value Date   WBC 11.1 (H) 08/17/2021   HGB 13.5 08/17/2021   HCT 41.7 08/17/2021   MCV 83 08/17/2021   PLT 331 08/17/2021       Component Value Date/Time   NA 136 01/12/2021 0422   K 3.9 01/12/2021 0422   CL 104 01/12/2021 0422   CO2 24 01/12/2021 0422   GLUCOSE 113 (H) 01/12/2021 0422   BUN 15 01/12/2021 0422   CREATININE 0.84 01/12/2021 0422   CALCIUM 8.4 (L) 01/12/2021 0422   PROT 6.9 04/06/2021 0942   AST 12 04/06/2021 0942   ALT 14 04/06/2021 0942   BILITOT 0.3 04/06/2021 0942   GFRNONAA >60 01/12/2021 0422   Lab Results  Component Value Date   CHOL 129 06/24/2015   HDL 26 (L) 06/24/2015   LDLCALC 75 06/24/2015   TRIG 140 06/24/2015   CHOLHDL 5.0 06/24/2015   No results found for: "HGBA1C" No results found for: "VITAMINB12" Lab Results  Component Value Date   TSH 1.536 06/24/2015       ASSESSMENT AND PLAN  Multiple sclerosis (HCC) - Plan: MR BRAIN W WO CONTRAST  High risk medication use  Numbness - Plan: MR BRAIN W WO CONTRAST  Empty sella (HCC)  Continue Tysabri.  JCV antibody was negative when last checked.  I will check an MRI of the brain and compare with her images from last year to determine if there is any subclinical progression.  We will need to consider switching to an anti-CD20 agent if progression is occurring. Stay active and exercise as tolerated. Although she has an empty sella on MRI, she is not reporting much difficulties with headaches and does not have papilledema. Return in 6 months or sooner if there are new or worsening neurologic symptoms.   Inocente Krach A. Epimenio Foot, MD, PhD, FAAN Certified in Neurology, Clinical Neurophysiology, Sleep Medicine, Pain Medicine and Neuroimaging Director, [facility] at [facility]  [facility] [address], [address] [place], [place] [address] [phone]

## 2021-11-24 ENCOUNTER — Telehealth: Payer: Self-pay | Admitting: Neurology

## 2021-11-24 NOTE — Telephone Encounter (Signed)
LVM for patient to call me back to schedule  Jodi Castillo: [phone] exp. 11/24/21-12/23/21 for [facility]

## 2021-12-12 ENCOUNTER — Telehealth: Payer: BC Managed Care – PPO | Admitting: Physician Assistant

## 2021-12-12 DIAGNOSIS — B3731 Acute candidiasis of vulva and vagina: Secondary | ICD-10-CM | POA: Diagnosis not present

## 2021-12-12 MED ORDER — FLUCONAZOLE 150 MG PO TABS
150.0000 mg | ORAL_TABLET | Freq: Once | ORAL | 0 refills | Status: AC
Start: 2021-12-12 — End: 2021-12-12

## 2021-12-12 MED ORDER — METRONIDAZOLE 500 MG PO TABS: 500.0000 mg | ORAL_TABLET | Freq: Two times a day (BID) | ORAL | 0 refills | Status: DC

## 2021-12-12 NOTE — Progress Notes (Signed)

## 2021-12-12 NOTE — Addendum Note (Signed)
Addended by: Roxy Horseman B on: 12/12/2021 08:19 AM   Modules accepted: Orders

## 2021-12-12 NOTE — Progress Notes (Signed)
I have spent 5 minutes in review of e-visit questionnaire, review and updating patient chart, medical decision making and response to patient.   Devika Dragovich Cody Jerold Yoss, PA-C    

## 2021-12-14 ENCOUNTER — Ambulatory Visit (INDEPENDENT_AMBULATORY_CARE_PROVIDER_SITE_OTHER): Payer: BC Managed Care – PPO

## 2021-12-14 DIAGNOSIS — R200 Anesthesia of skin: Secondary | ICD-10-CM

## 2021-12-14 DIAGNOSIS — G35 Multiple sclerosis: Secondary | ICD-10-CM | POA: Diagnosis not present

## 2021-12-14 MED ORDER — GADOBENATE DIMEGLUMINE 529 MG/ML IV SOLN
20.0000 mL | Freq: Once | INTRAVENOUS | Status: AC | PRN
  Administered 2021-12-14: 20 mL via INTRAVENOUS

## 2022-01-17 DIAGNOSIS — G35 Multiple sclerosis: Secondary | ICD-10-CM | POA: Diagnosis not present

## 2022-02-14 ENCOUNTER — Other Ambulatory Visit: Payer: Self-pay | Admitting: *Deleted

## 2022-02-14 ENCOUNTER — Other Ambulatory Visit (INDEPENDENT_AMBULATORY_CARE_PROVIDER_SITE_OTHER): Payer: BC Managed Care – PPO

## 2022-02-14 DIAGNOSIS — Z79899 Other long term (current) drug therapy: Secondary | ICD-10-CM

## 2022-02-14 DIAGNOSIS — G35 Multiple sclerosis: Secondary | ICD-10-CM

## 2022-02-14 DIAGNOSIS — Z0289 Encounter for other administrative examinations: Secondary | ICD-10-CM

## 2022-02-14 NOTE — Progress Notes (Signed)
Placed JCV lab in [facility] lock box for routine lab pick up. Results pending. 

## 2022-02-15 ENCOUNTER — Encounter: Payer: Self-pay | Admitting: Neurology

## 2022-02-15 LAB — CBC WITH DIFFERENTIAL/PLATELET
Basophils Absolute: 0.1 10*3/uL (ref 0.0–0.2)
Basos: 1 %
EOS (ABSOLUTE): 0.1 10*3/uL (ref 0.0–0.4)
Eos: 2 %
Hematocrit: 38.8 % (ref 34.0–46.6)
Hemoglobin: 12.8 g/dL (ref 11.1–15.9)
Immature Grans (Abs): 0 10*3/uL (ref 0.0–0.1)
Immature Granulocytes: 0 %
Lymphocytes Absolute: 3 10*3/uL (ref 0.7–3.1)
Lymphs: 39 %
MCH: 27.3 pg (ref 26.6–33.0)
MCHC: 33 g/dL (ref 31.5–35.7)
MCV: 83 fL (ref 79–97)
Monocytes Absolute: 0.4 10*3/uL (ref 0.1–0.9)
Monocytes: 5 %
Neutrophils Absolute: 4 10*3/uL (ref 1.4–7.0)
Neutrophils: 53 %
Platelets: 276 10*3/uL (ref 150–450)
RBC: 4.69 x10E6/uL (ref 3.77–5.28)
RDW: 13.2 % (ref 11.7–15.4)
WBC: 7.7 10*3/uL (ref 3.4–10.8)

## 2022-02-21 ENCOUNTER — Encounter: Payer: Self-pay | Admitting: Neurology

## 2022-02-21 ENCOUNTER — Telehealth: Payer: Self-pay

## 2022-02-21 ENCOUNTER — Telehealth: Payer: Self-pay | Admitting: *Deleted

## 2022-02-21 NOTE — Telephone Encounter (Signed)
PA for TYSABRI (natalizumab) 300MG /15ML concentrate on CMM.  Key: [id number]

## 2022-02-21 NOTE — Telephone Encounter (Signed)
JCV ab drawn on 02/14/22 negative, index: 0.15.

## 2022-03-03 ENCOUNTER — Encounter: Payer: Self-pay | Admitting: Neurology

## 2022-03-07 DIAGNOSIS — Z0289 Encounter for other administrative examinations: Secondary | ICD-10-CM

## 2022-03-08 ENCOUNTER — Encounter: Payer: Self-pay | Admitting: *Deleted

## 2022-03-13 NOTE — Telephone Encounter (Signed)
Gave completed/signed FMLA back to medical records to process for pt. 

## 2022-03-14 ENCOUNTER — Telehealth: Payer: Self-pay | Admitting: Adult Health

## 2022-03-14 ENCOUNTER — Telehealth: Payer: Self-pay | Admitting: Neurology

## 2022-03-14 DIAGNOSIS — G35 Multiple sclerosis: Secondary | ICD-10-CM | POA: Diagnosis not present

## 2022-03-14 NOTE — Telephone Encounter (Signed)
error 

## 2022-03-14 NOTE — Telephone Encounter (Signed)
FMLA paperwork faxed to [employer] on 10/17

## 2022-04-03 NOTE — Telephone Encounter (Signed)
Gave completed/signed form back to medical records to process for pt. 

## 2022-04-12 DIAGNOSIS — G35 Multiple sclerosis: Secondary | ICD-10-CM | POA: Diagnosis not present

## 2022-04-25 ENCOUNTER — Telehealth: Payer: Self-pay | Admitting: Neurology

## 2022-04-25 NOTE — Telephone Encounter (Signed)
Pt is calling. Stated she is following up on a form that was supposed to be filled out by Dr. Epimenio Foot. Pt is requesting a call-back.

## 2022-04-25 NOTE — Telephone Encounter (Signed)
I gave completed form back on 04/03/22 for Debra to process. Can someone f/u with her to see what she needs?

## 2022-04-26 NOTE — Telephone Encounter (Signed)
Spoke with pt to inform of medical records being refaxed

## 2022-05-17 DIAGNOSIS — G35 Multiple sclerosis: Secondary | ICD-10-CM | POA: Diagnosis not present

## 2022-06-05 NOTE — Progress Notes (Unsigned)
[facility]  PATIENT: Jodi Castillo DOB: 1991-01-25  REFERRING DOCTOR OR PCP:  Maud Deed, PA-C SOURCE: Patient, notes from ED/hospital, imaging and lab reports reviewed, MRI images personally reviewed.  _________________________________   HISTORICAL  CHIEF COMPLAINT:  No chief complaint on file.   HISTORY OF PRESENT ILLNESS:  Jodi Castillo is a 32 y.o. woman with  MS.    UPDATE 06/06/2022: Returns for 63-month follow-up.  Was previously seen by Dr. Epimenio Foot 11/15/2021.  She started Tysabri in [place] 2022 and has tolerated it well.  She has no side effects and feels good. MRI brain w/wo 11/2021 no new lesions identified compared to 2022 imaging.  She denies any new neurologic symptoms.   No exacerbations.  She has no difficulties with gait, balance, strength or numbness.  There is very slight visual change on the right.  Colors are symmetric.  She has mild urinary urgency.  She denies much fatigue.  She usually sleeps well.  No difficulties with cognition or mood.      She has hypertension and is on propranolol ad HCTZ.     She has had some boils that   MRI also showed an empty sella.    She denies headaches and no major visual change.   She pans on seeing ophthalmology soon  .  No diplopia.   She does not feels lightheaded upon standing.      MS History: She is a 32 year old woman who had the onset of left foot and leg numbness around 01/05/2021 that increased and involved her left arm over the next 2 days.   She had fairly mild left facial numbness.   She did not note any definite weakness but she noted that her left leg gave out ad she stumbled a few times.     She went to Urgent Care because she though she had a pinched nerve and was prescribed a standard steroid pack.   A couple days later, as symptoms worsened, she also noted some leg pain, and she went to the ED.   She had brain and cervical spine imaging showing changes c/w MS.   Two white matter  lesions enhanced after contrast c/w acute demyelinating lesions.   There were no lesions in the spinal cord though 1 is seen in the medulla.  She received IV steroids x 1 gram once and then was discharged on 625 mg prednisone x 2 more days.      Numbness resolved over the next few days and she currently feels back to baseline.  Imaging: MRI of the brain 01/12/2021 showed T2/flair hyperintense foci in the hemispheres, some of the periventricular white matter and 1 in the medial left parietal lobe in the juxtacortical white matter.  A focus in the right parietal centrum semiovale enhanced after contrast was administered consistent with an acute demyelinating plaque.  Additionally, a focus adjacent to the right trigone had subtle enhancement.  A nonenhancing focus was also seen in the right medulla.   She also has an enlarged sella turcica and flattened pituitary.    Optic nerve sheaths were normal.     MRI of the cervical spine 01/12/2021 showed a normal spinal cord.  The focus in the medulla lobe is redemonstrated.  Normal enhancement pattern.  Laboratory tests:: Beta hCG, CBC and BMP were noncontributory 01/12/2021.  Vitamin B12 and TSH were normal 01/07/2021.  REVIEW OF SYSTEMS: Constitutional: No fevers, chills, sweats, or change in appetite Eyes: No visual changes, double vision,  eye pain Ear, nose and throat: No hearing loss, ear pain, nasal congestion, sore throat Cardiovascular: No chest pain, palpitations Respiratory:  No shortness of breath at rest or with exertion.   No wheezes GastrointestinaI: No nausea, vomiting, diarrhea, abdominal pain, fecal incontinence Genitourinary:  No dysuria, urinary retention or frequency.  No nocturia. Musculoskeletal:  No neck pain, back pain Integumentary: No rash, pruritus, skin lesions Neurological: as above Psychiatric: No depression at this time.  No anxiety Endocrine: No palpitations, diaphoresis, change in appetite, change in weigh or increased  thirst Hematologic/Lymphatic:  No anemia, purpura, petechiae. Allergic/Immunologic: No itchy/runny eyes, nasal congestion, recent allergic reactions, rashes  ALLERGIES: No Known Allergies  HOME MEDICATIONS:  Current Outpatient Medications:    hydrochlorothiazide (MICROZIDE) 12.5 MG capsule, Take 12.5 mg by mouth daily., Disp: , Rfl:    metroNIDAZOLE (FLAGYL) 500 MG tablet, Take 1 tablet (500 mg total) by mouth 2 (two) times daily., Disp: 14 tablet, Rfl: 0   natalizumab (TYSABRI) 300 MG/15ML injection, Inject into the vein., Disp: , Rfl:    norethindrone-ethinyl estradiol-FE (JUNEL FE 1/20) 1-20 MG-MCG tablet, Take 1 tablet by mouth daily. (Patient not taking: Reported on 11/23/2021), Disp: 84 tablet, Rfl: 3   propranolol (INDERAL) 40 MG tablet, Take 40 mg by mouth 2 (two) times daily., Disp: , Rfl:   PAST MEDICAL HISTORY: Past Medical History:  Diagnosis Date   Hypertension    Irregular periods/menstrual cycles    Multiple sclerosis ([place]) 01/12/2021    PAST SURGICAL HISTORY: No past surgical history on file.  FAMILY HISTORY: Family History  Problem Relation Age of Onset   Hypertension Mother    Stroke Mother    Psoriasis Mother    Crohn's disease Sister    Diabetes Maternal Aunt    Atrial fibrillation Maternal Aunt    Asthma Maternal Aunt    Cancer Maternal Grandmother        Pancreatic    SOCIAL HISTORY:  Social History   Socioeconomic History   Marital status: Single    Spouse name: Not on file   Number of children: Not on file   Years of education: Not on file   Highest education level: Some college, no degree  Occupational History   Not on file  Tobacco Use   Smoking status: Former    Types: Cigarettes   Smokeless tobacco: Never  Vaping Use   Vaping Use: Never used  Substance and Sexual Activity   Alcohol use: Yes    Comment: Occas   Drug use: No   Sexual activity: Yes    Birth control/protection: None  Other Topics Concern   Not on file  Social  History Narrative   Lives w boyfriend   Left handed   Caffeine: 2 cups of coffee in the AM   Social Determinants of Health   Financial Resource Strain: Not on file  Food Insecurity: Not on file  Transportation Needs: Not on file  Physical Activity: Not on file  Stress: Not on file  Social Connections: Not on file  Intimate Partner Violence: Not on file     PHYSICAL EXAM  There were no vitals filed for this visit.   There is no height or weight on file to calculate BMI.  No results found.    General: The patient is well-developed and well-nourished and in no acute distress  HEENT:  Head is [place]/AT.  Sclera are anicteric.  Funduscopic exam shows normal optic discs and retinal vessels.  Neck: Good range of motion  Skin: Extremities are without rash or  edema.  Musculoskeletal:  Back is nontender  Neurologic Exam  Mental status: The patient is alert and oriented x 3 at the time of the examination. The patient has apparent normal recent and remote memory, with an apparently normal attention span and concentration ability.   Speech is normal.  Cranial nerves: Extraocular movements are full.  Facial strength and sensation was normal.. No obvious hearing deficits are noted.  Motor:  Muscle bulk is normal.   Tone is normal. Strength is  5 / 5 in all 4 extremities.   Sensory: Sensory testing is intact to pinprick, soft touch and vibration sensation in all 4 extremities.  Coordination: Cerebellar testing reveals good finger-nose-finger and heel-to-shin bilaterally.  Gait and station: Station is normal.   Gait is normal. Tandem gait is normal. Romberg is negative.   Reflexes: Deep tendon reflexes are symmetric and normal bilaterally.        DIAGNOSTIC DATA (LABS, IMAGING, TESTING) - I reviewed patient records, labs, notes, testing and imaging myself where available.  Lab Results  Component Value Date   WBC 7.7 02/14/2022   HGB 12.8 02/14/2022   HCT 38.8 02/14/2022    MCV 83 02/14/2022   PLT 276 02/14/2022      Component Value Date/Time   NA 136 01/12/2021 0422   K 3.9 01/12/2021 0422   CL 104 01/12/2021 0422   CO2 24 01/12/2021 0422   GLUCOSE 113 (H) 01/12/2021 0422   BUN 15 01/12/2021 0422   CREATININE 0.84 01/12/2021 0422   CALCIUM 8.4 (L) 01/12/2021 0422   PROT 6.9 04/06/2021 0942   AST 12 04/06/2021 0942   ALT 14 04/06/2021 0942   BILITOT 0.3 04/06/2021 0942   GFRNONAA >60 01/12/2021 0422   Lab Results  Component Value Date   CHOL 129 06/24/2015   HDL 26 (L) 06/24/2015   LDLCALC 75 06/24/2015   TRIG 140 06/24/2015   CHOLHDL 5.0 06/24/2015   No results found for: "HGBA1C" No results found for: "VITAMINB12" Lab Results  Component Value Date   TSH 1.536 06/24/2015       ASSESSMENT AND PLAN  No diagnosis found.  Continue Tysabri.  JCV antibody was negative when last checked. MRI brain 11/2021 without any new findings compared to 2022 imaging.  Stay active and exercise as tolerated. Although she has an empty sella on MRI, she is not reporting much difficulties with headaches and does not have papilledema. Return in 6 months or sooner if there are new or worsening neurologic symptoms.    I spent *** minutes of face-to-face and non-face-to-face time with patient.  This included previsit chart review, lab review, study review, order entry, electronic health record documentation, patient education  Frann Rider, [facility]  [facility] [address] [place], [place] [address]  Phone [phone] Fax [phone] Note: This document was prepared with digital dictation and possible smart phrase technology. Any transcriptional errors that result from this process are unintentional.

## 2022-06-06 ENCOUNTER — Ambulatory Visit (INDEPENDENT_AMBULATORY_CARE_PROVIDER_SITE_OTHER): Payer: No Typology Code available for payment source | Admitting: Adult Health

## 2022-06-06 ENCOUNTER — Encounter: Payer: Self-pay | Admitting: Adult Health

## 2022-06-06 VITALS — BP 143/90 | HR 98 | Ht 70.0 in | Wt 317.8 lb

## 2022-06-06 DIAGNOSIS — G35 Multiple sclerosis: Secondary | ICD-10-CM

## 2022-06-06 DIAGNOSIS — Z79899 Other long term (current) drug therapy: Secondary | ICD-10-CM | POA: Diagnosis not present

## 2022-06-06 NOTE — Patient Instructions (Signed)
Your Plan:  Continue Tysabri   Will check lab work today     Follow up in 6 months or call earlier if needed     Thank you for coming to see [place] at [facility]. I hope we have been able to provide you high quality care today.  You may receive a patient satisfaction survey over the next few weeks. We would appreciate your feedback and comments so that we may continue to improve ourselves and the health of our patients.

## 2022-06-20 LAB — CBC WITH DIFFERENTIAL/PLATELET
Basophils Absolute: 0.1 10*3/uL (ref 0.0–0.2)
Basos: 1 %
EOS (ABSOLUTE): 0.3 10*3/uL (ref 0.0–0.4)
Eos: 3 %
Hematocrit: 41.2 % (ref 34.0–46.6)
Hemoglobin: 13 g/dL (ref 11.1–15.9)
Immature Grans (Abs): 0.1 10*3/uL (ref 0.0–0.1)
Immature Granulocytes: 1 %
Lymphocytes Absolute: 4.1 10*3/uL — ABNORMAL HIGH (ref 0.7–3.1)
Lymphs: 41 %
MCH: 27.1 pg (ref 26.6–33.0)
MCHC: 31.6 g/dL (ref 31.5–35.7)
MCV: 86 fL (ref 79–97)
Monocytes Absolute: 1.1 10*3/uL — ABNORMAL HIGH (ref 0.1–0.9)
Monocytes: 11 %
Neutrophils Absolute: 4.3 10*3/uL (ref 1.4–7.0)
Neutrophils: 43 %
Platelets: 279 10*3/uL (ref 150–450)
RBC: 4.8 x10E6/uL (ref 3.77–5.28)
RDW: 13.3 % (ref 11.7–15.4)
WBC: 9.9 10*3/uL (ref 3.4–10.8)

## 2022-06-20 LAB — STRATIFY JCV(TM) AB W/INDEX
JCV Antibody: NEGATIVE
JCV Index Value: 0.14

## 2022-10-28 ENCOUNTER — Other Ambulatory Visit: Payer: Self-pay | Admitting: Nurse Practitioner

## 2022-10-28 DIAGNOSIS — Z30011 Encounter for initial prescription of contraceptive pills: Secondary | ICD-10-CM

## 2022-10-30 NOTE — Telephone Encounter (Signed)
Per alternate provider, medication was discontinued in EMR due to pt reported not taking for an extended amount of time. Will send pt mychart msg to inquire.   Last AEX 11/10/2021--recall sent per EMR for 10/2022. Nothing currently scheduled.

## 2022-10-30 NOTE — Telephone Encounter (Signed)
Rx pend

## 2022-11-07 IMAGING — MR MR CERVICAL SPINE WO/W CM
7 of 20 series · 12 of 48 positions shown · IV contrast (gadavist)
Comparison: None.

CLINICAL DATA: History of multiple sclerosis with new left-sided
numbness for 2 weeks

EXAM:
MRI HEAD WITHOUT AND WITH CONTRAST
MRI CERVICAL SPINE WITHOUT AND WITH CONTRAST
TECHNIQUE: Multiplanar, multiecho pulse sequences of the brain and surrounding
structures, and cervical spine, to include the craniocervical
junction and cervicothoracic junction, were obtained without and
with intravenous contrast.
CONTRAST:  10mL GADAVIST GADOBUTROL 1 MMOL/ML IV SOLN

[Series 5: T2 · axial · 5.0mm · 0.23mm/px · 1 of 25 slices shown (1 of 3)]
[im 1/25]
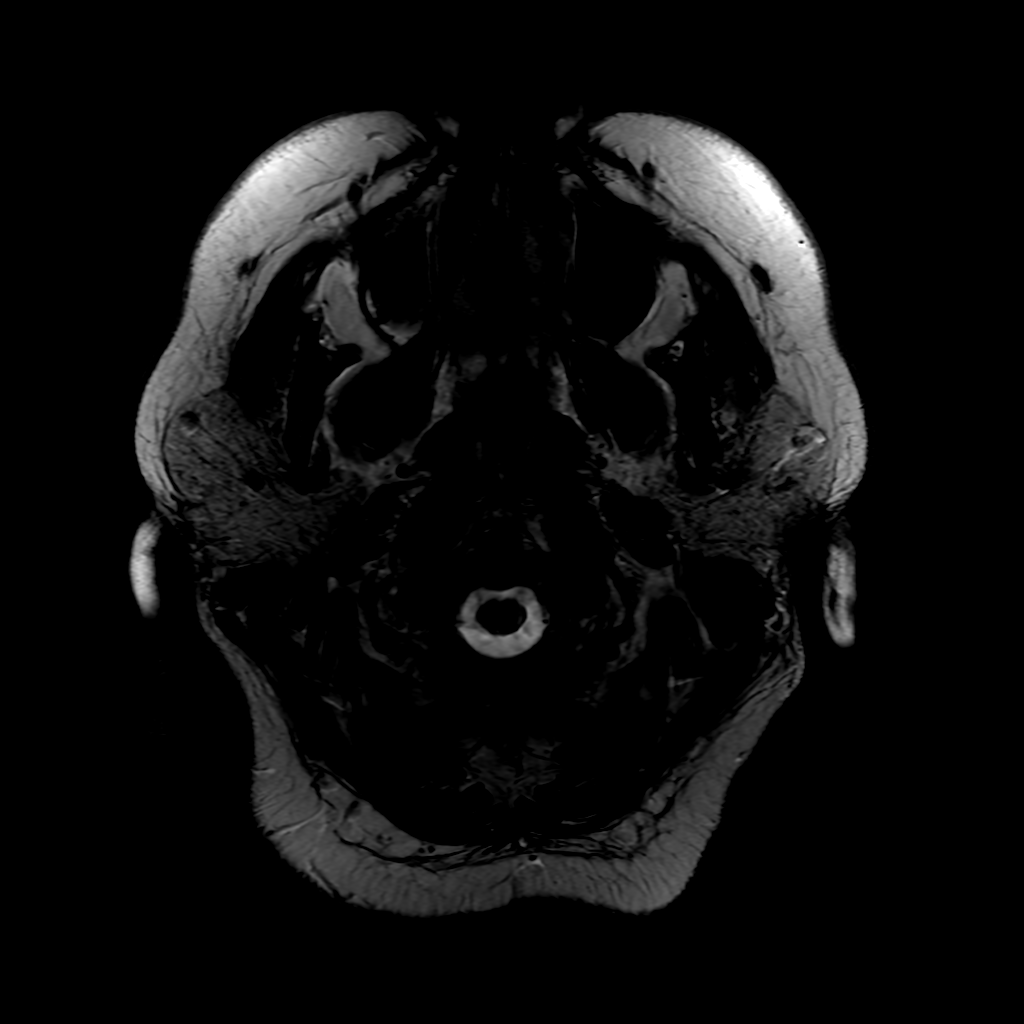

[Series 10: T2 · sagittal · 3.0mm · 0.43mm/px · 1 of 16 slices shown (2 of 3)]
[im 1/16]
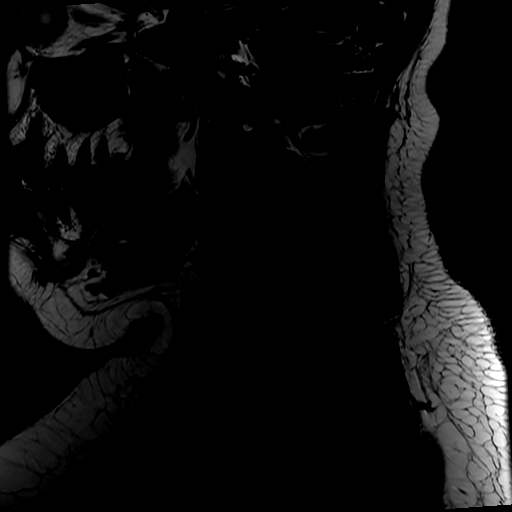

[Series 14: T2 · axial · 3.0mm · 0.35mm/px · z∈[-262,-159]mm · 2 of 32 slices shown (3 of 3)]
[im 1/32]
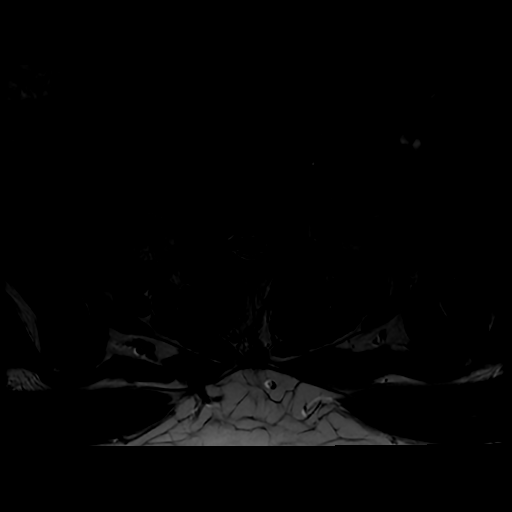
[im 32/32]
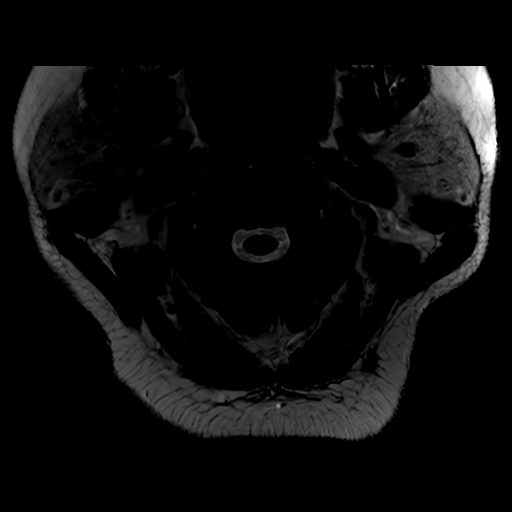

[Series 15: T1 · axial · non-contrast · 3.0mm · 0.35mm/px · z∈[-262,-159]mm · 2 of 32 slices shown (1 of 3)]
[im 1/32]
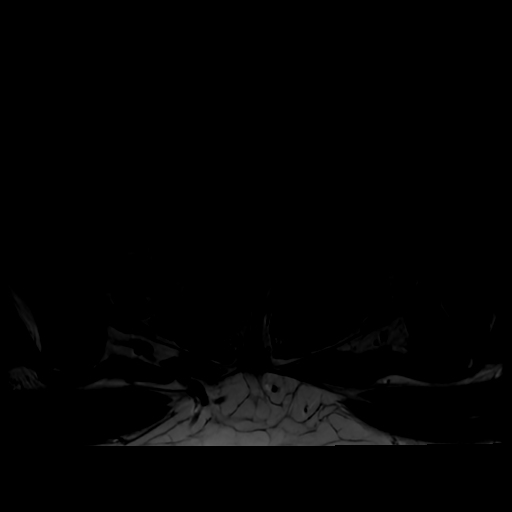
[im 32/32]
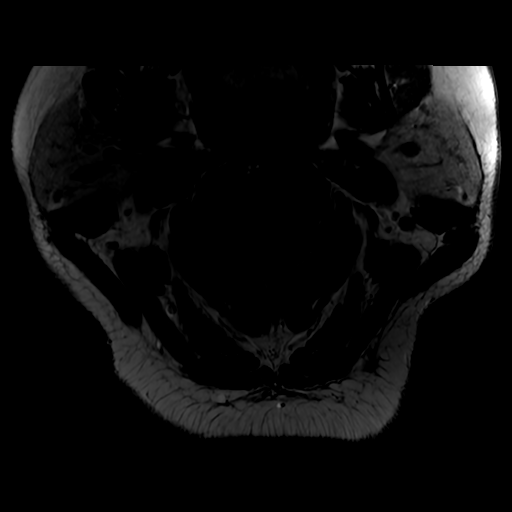

[Series 16: T2 post-contrast · coronal · 5.0mm · 0.20mm/px · 2 of 31 slices shown]
[im 1/31]
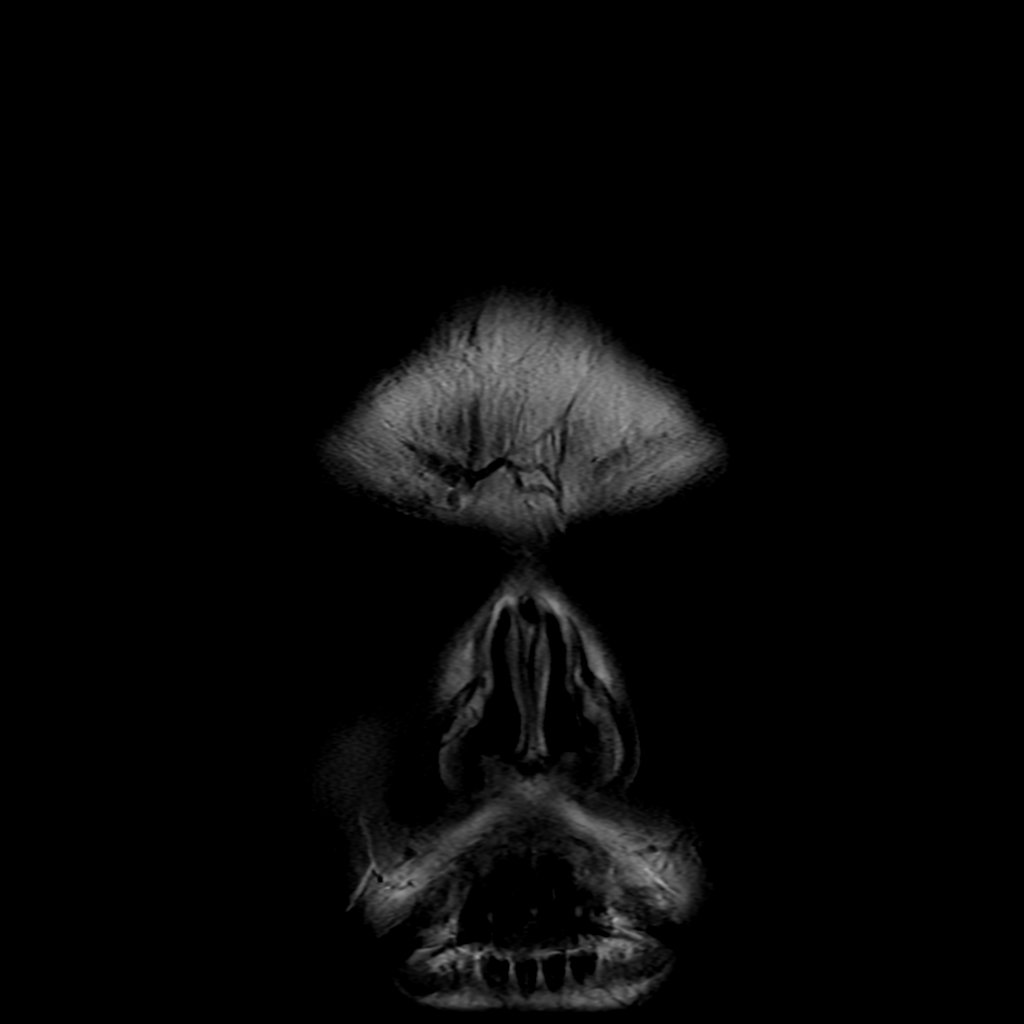
[im 31/31]
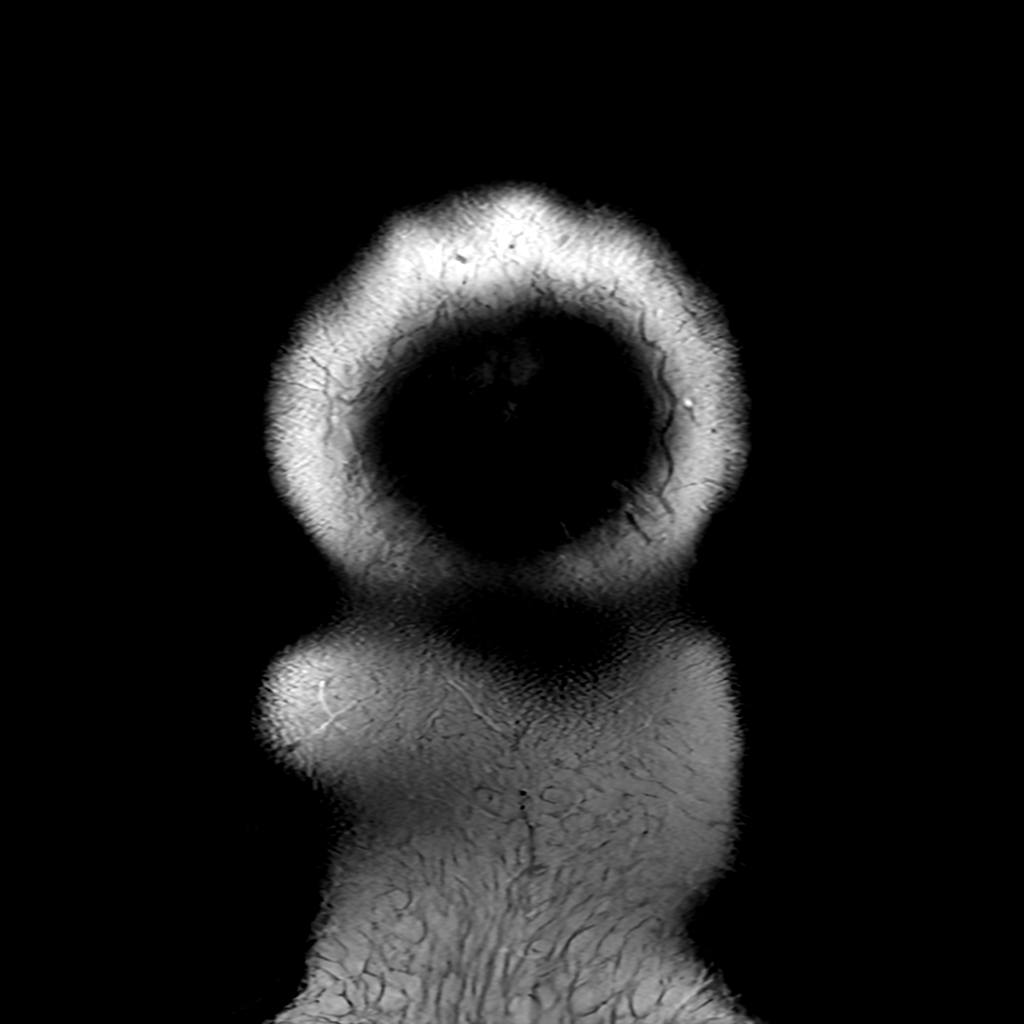

[Series 17: T1 · axial · 3.0mm · 0.94mm/px · z∈[-138,+7]mm · 3 of 50 slices shown (2 of 3)]
[im 1/50]
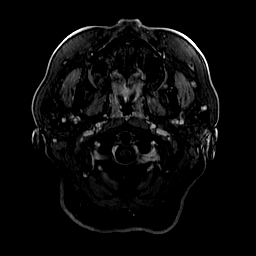
[im 25/50]
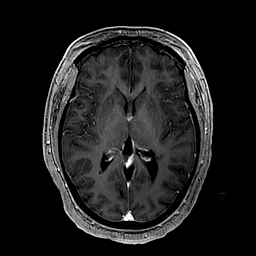
[im 50/50]
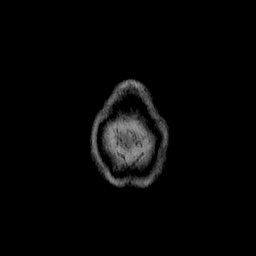

[Series 18: T1 · coronal · 5.0mm · 0.43mm/px · 1 of 31 slices shown (3 of 3)]
[im 1/31]
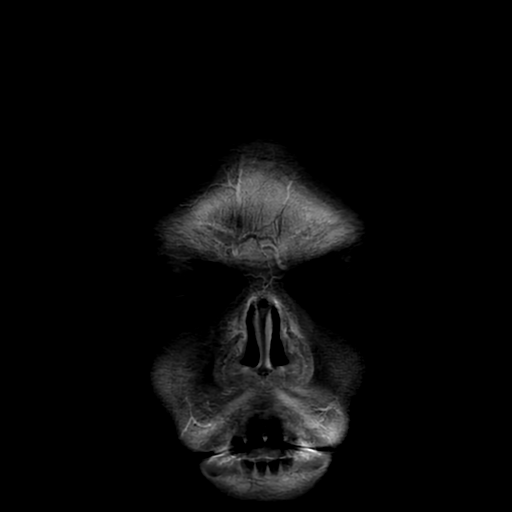

[12 of 48 positions shown; findings below may reference images not displayed]

FINDINGS: MRI HEAD FINDINGS

Brain:

There are multiple foci of FLAIR signal abnormality in the bilateral
periventricular white matter in a distribution consistent with the
provided history of multiple sclerosis. There is peripheral
enhancement around the lesion in the right centrum semiovale
consistent with active demyelination (17-32). There is additional
faint enhancement around the lesion in the right peritrigonal region
(17-25).

There is no evidence of acute intracranial hemorrhage or infarct.
There is no extra-axial fluid collection. No mass lesion is
identified. There is no midline shift. The sella appears prominent
and partially empty.

Vascular: Normal flow voids.

Skull and upper cervical spine: Normal marrow signal.

Sinuses/Orbits: The paranasal sinuses are clear. The globes are
unremarkable. The optic nerves are normal with no evidence of optic
neuritis.

Other: None.

MRI CERVICAL SPINE FINDINGS

Image quality in the cervical spine is degraded by motion artifact,
particularly on the axial T2 sequence.

Alignment: There is straightening of the normal cervical spine
lordosis with slight kyphosis centered at C5-C6. There is no
anterior or retrolisthesis.

Vertebrae: Vertebral body heights are preserved. Marrow signal is
normal.

Cord: There is no definite evidence of demyelination in the cervical
cord, within the confines of motion degraded images. There is no
abnormal enhancement.

Posterior Fossa, vertebral arteries, paraspinal tissues: The brain
is assessed on the above-described brain MRI.

Disc levels: There is no significant spinal canal or neural
foraminal stenosis in the cervical spine.
IMPRESSION: MR BRAIN:

Findings above consistent with the provided history of multiple
sclerosis with evidence of active demyelination involving two
lesions in the right cerebral hemisphere described above.

MR CERVICAL SPINE:

No definite evidence of demyelinating lesion in the cervical spine,
within the confines of motion degraded images. No abnormal
enhancement.

## 2022-12-05 ENCOUNTER — Telehealth: Payer: Self-pay

## 2022-12-05 ENCOUNTER — Encounter: Payer: Self-pay | Admitting: Neurology

## 2022-12-05 ENCOUNTER — Telehealth: Payer: Self-pay | Admitting: Neurology

## 2022-12-05 ENCOUNTER — Other Ambulatory Visit: Payer: Self-pay

## 2022-12-05 ENCOUNTER — Ambulatory Visit (INDEPENDENT_AMBULATORY_CARE_PROVIDER_SITE_OTHER): Payer: No Typology Code available for payment source | Admitting: Neurology

## 2022-12-05 VITALS — BP 137/95 | HR 88 | Ht 70.0 in | Wt 319.2 lb

## 2022-12-05 DIAGNOSIS — Z79899 Other long term (current) drug therapy: Secondary | ICD-10-CM | POA: Diagnosis not present

## 2022-12-05 DIAGNOSIS — E559 Vitamin D deficiency, unspecified: Secondary | ICD-10-CM

## 2022-12-05 DIAGNOSIS — R200 Anesthesia of skin: Secondary | ICD-10-CM

## 2022-12-05 DIAGNOSIS — G35 Multiple sclerosis: Secondary | ICD-10-CM | POA: Diagnosis not present

## 2022-12-05 DIAGNOSIS — E236 Other disorders of pituitary gland: Secondary | ICD-10-CM

## 2022-12-05 NOTE — Telephone Encounter (Signed)
Placed JCV in [facility] 12/05/2022

## 2022-12-05 NOTE — Telephone Encounter (Signed)
sent to GI due to her weight, they obtain Drucie Opitz [phone]

## 2022-12-05 NOTE — Progress Notes (Signed)
[facility]  PATIENT: Jodi Castillo DOB: 1990-11-10  REFERRING DOCTOR OR PCP:  Maud Deed, PA-C SOURCE: Patient, notes from ED/hospital, imaging and lab reports reviewed, MRI images personally reviewed.  _________________________________   HISTORICAL  CHIEF COMPLAINT:  Chief Complaint  Patient presents with   Follow-up    Pt in room 11. Here for MS follow up. Pt reports doing well.  On Tysabri.    HISTORY OF PRESENT ILLNESS:  Jodi Castillo is a 32 y.o. woman with  MS.    UPDATE 12/05/2022: She started Tysabri in Tullos 2022 and has tolerated it well.  .  She has no side effects and feels good.   JCV was negative 02/21/2022 at 0.15  She denies any new neurologic symptoms.   No exacerbations.  She has no difficulties with gait, balance, strength.  She has slight sensation asymmetry, reduced on her left.  No dysesthesia..    She often uses the bannister on stairsand feels balance just slightly off.    She can easily walk 3 miles and keeps up with others.     There is very slight visual change on the right.  Reading out of that eye is blurry.  Colors are symmetric.    She has mild urinary urgency..  If she coughs she has incontinence.      She denies much fatigue.  She usually sleeps well.  No difficulties with cognition or mood.      She has hypertension and is on propranolol ad HCTZ.     She has had some boils that   MRI also showed an empty sella.    She denies headaches and no major visual change.   She sees ophthalmology anually  .  No diplopia.   She does not feels lightheaded upon standing.      MS History: She had the onset of left foot and leg numbness around 01/05/2021 that increased and involved her left arm over the next 2 days.   She had fairly mild left facial numbness.   She did not note any definite weakness but she noted that her left leg gave out ad she stumbled a few times.     She went to Urgent Care because she though she had a  pinched nerve and was prescribed a standard steroid pack.   A couple days later, as symptoms worsened, she also noted some leg pain, and she went to the ED.   She had brain and cervical spine imaging showing changes c/w MS.   Two white matter lesions enhanced after contrast c/w acute demyelinating lesions.   There were no lesions in the spinal cord though 1 is seen in the medulla.  She received IV steroids x 1 gram once and then was discharged on 625 mg prednisone x 2 more days.      Numbness resolved over the next few days and she currently feels back to baseline.  Imaging: MRI of the brain 01/12/2021 showed T2/flair hyperintense foci in the hemispheres, some of the periventricular white matter and 1 in the medial left parietal lobe in the juxtacortical white matter.  A focus in the right parietal centrum semiovale enhanced after contrast was administered consistent with an acute demyelinating plaque.  Additionally, a focus adjacent to the right trigone had subtle enhancement.  A nonenhancing focus was also seen in the right medulla.   She also has an enlarged sella turcica and flattened pituitary.    Optic nerve sheaths were normal.  MRI of the cervical spine 01/12/2021 showed a normal spinal cord.  The focus in the medulla lobe is redemonstrated.  Normal enhancement pattern.  MRI brain 12/14/21 showed Scattered T2/FLAIR hyperintense foci in the cerebral hemispheres, predominantly in the periventricular white matter, and in the right medulla.  These are consistent with chronic demyelinating plaque associated with multiple sclerosis.  None of the foci appear to be acute.  They do not enhance.  Compared to the MRI from 01/12/2021, there were no new lesions. Pituitary tissue is thinned within a mildly enlarged sella turcica consistent with a partially empty sella.  This is usually incidental but can also be seen with elevated intracranial pressure.  It is stable compared to the 2022 MRI.  Laboratory  tests:: Beta hCG, CBC and BMP were noncontributory 01/12/2021.  Vitamin B12 and TSH were normal 01/07/2021.  REVIEW OF SYSTEMS: Constitutional: No fevers, chills, sweats, or change in appetite Eyes: No visual changes, double vision, eye pain Ear, nose and throat: No hearing loss, ear pain, nasal congestion, sore throat Cardiovascular: No chest pain, palpitations Respiratory:  No shortness of breath at rest or with exertion.   No wheezes GastrointestinaI: No nausea, vomiting, diarrhea, abdominal pain, fecal incontinence Genitourinary:  No dysuria, urinary retention or frequency.  No nocturia. Musculoskeletal:  No neck pain, back pain Integumentary: No rash, pruritus, skin lesions Neurological: as above Psychiatric: No depression at this time.  No anxiety Endocrine: No palpitations, diaphoresis, change in appetite, change in weigh or increased thirst Hematologic/Lymphatic:  No anemia, purpura, petechiae. Allergic/Immunologic: No itchy/runny eyes, nasal congestion, recent allergic reactions, rashes  ALLERGIES: No Known Allergies  HOME MEDICATIONS:  Current Outpatient Medications:    BLISOVI FE 1/20 1-20 MG-MCG tablet, TAKE 1 TABLET BY MOUTH DAILY, Disp: 84 tablet, Rfl: 0   hydrochlorothiazide (MICROZIDE) 12.5 MG capsule, Take 12.5 mg by mouth daily., Disp: , Rfl:    natalizumab (TYSABRI) 300 MG/15ML injection, Inject into the vein., Disp: , Rfl:    propranolol (INDERAL) 40 MG tablet, Take 40 mg by mouth 2 (two) times daily., Disp: , Rfl:   PAST MEDICAL HISTORY: Past Medical History:  Diagnosis Date   Hypertension    Irregular periods/menstrual cycles    Multiple sclerosis (HCC) 01/12/2021    PAST SURGICAL HISTORY: No past surgical history on file.  FAMILY HISTORY: Family History  Problem Relation Age of Onset   Hypertension Mother    Stroke Mother    Psoriasis Mother    Crohn's disease Sister    Diabetes Maternal Aunt    Atrial fibrillation Maternal Aunt    Asthma  Maternal Aunt    Cancer Maternal Grandmother        Pancreatic    SOCIAL HISTORY:  Social History   Socioeconomic History   Marital status: Single    Spouse name: Not on file   Number of children: Not on file   Years of education: Not on file   Highest education level: Some college, no degree  Occupational History   Not on file  Tobacco Use   Smoking status: Former    Types: Cigarettes   Smokeless tobacco: Never  Vaping Use   Vaping Use: Never used  Substance and Sexual Activity   Alcohol use: Yes    Comment: Occas   Drug use: No   Sexual activity: Yes    Birth control/protection: None  Other Topics Concern   Not on file  Social History Narrative   Lives w boyfriend   Left handed  Caffeine: 2 cups of coffee in the AM   Social Determinants of Health   Financial Resource Strain: Not on file  Food Insecurity: Not on file  Transportation Needs: Not on file  Physical Activity: Not on file  Stress: Not on file  Social Connections: Not on file  Intimate Partner Violence: Not on file     PHYSICAL EXAM  Vitals:   12/05/22 1039  BP: (!) 137/95  Pulse: 88  Weight: (!) 319 lb 3.2 oz (144.8 kg)  Height: 5\' 10"  (1.778 m)    Body mass index is 45.8 kg/m.  No results found.    General: The patient is well-developed and well-nourished and in no acute distress  HEENT:  Head is [place]/AT.  Sclera are anicteric.  Funduscopic exam shows normal optic discs and retinal vessels.  Neck: Good range of motion  Skin: Extremities are without rash or  edema.  Musculoskeletal:  Back is nontender  Neurologic Exam  Mental status: The patient is alert and oriented x 3 at the time of the examination. The patient has apparent normal recent and remote memory, with an apparently normal attention span and concentration ability.   Speech is normal.  Cranial nerves: Extraocular movements are full.  Facial strength and sensation was normal.. No obvious hearing deficits are  noted.  Motor:  Muscle bulk is normal.   Tone is normal. Strength is  5 / 5 in all 4 extremities.   Sensory: She reports mildly reduced sensation to touch and temperature on the left..  Coordination: Cerebellar testing reveals good finger-nose-finger and heel-to-shin bilaterally.  Gait and station: Station is normal.   Gait is normal. Tandem gait is normal. Romberg is negative.   Reflexes: Deep tendon reflexes are symmetric and normal bilaterally.        DIAGNOSTIC DATA (LABS, IMAGING, TESTING) - I reviewed patient records, labs, notes, testing and imaging myself where available.  Lab Results  Component Value Date   WBC 9.9 06/06/2022   HGB 13.0 06/06/2022   HCT 41.2 06/06/2022   MCV 86 06/06/2022   PLT 279 06/06/2022      Component Value Date/Time   NA 136 01/12/2021 0422   K 3.9 01/12/2021 0422   CL 104 01/12/2021 0422   CO2 24 01/12/2021 0422   GLUCOSE 113 (H) 01/12/2021 0422   BUN 15 01/12/2021 0422   CREATININE 0.84 01/12/2021 0422   CALCIUM 8.4 (L) 01/12/2021 0422   PROT 6.9 04/06/2021 0942   AST 12 04/06/2021 0942   ALT 14 04/06/2021 0942   BILITOT 0.3 04/06/2021 0942   GFRNONAA >60 01/12/2021 0422   Lab Results  Component Value Date   CHOL 129 06/24/2015   HDL 26 (L) 06/24/2015   LDLCALC 75 06/24/2015   TRIG 140 06/24/2015   CHOLHDL 5.0 06/24/2015   No results found for: "HGBA1C" No results found for: "VITAMINB12" Lab Results  Component Value Date   TSH 1.536 06/24/2015       ASSESSMENT AND PLAN  Multiple sclerosis (HCC) - Plan: Stratify JCV Antibody Test ([facility]), CBC with Differential/Platelet  High risk medication use - Plan: Stratify JCV Antibody Test ([facility]), CBC with Differential/Platelet  Numbness  Empty sella (HCC)  Vitamin D deficiency - Plan: VITAMIN D 25 Hydroxy (Vit-D Deficiency, Fractures)  Continue Tysabri.  We will check the JCV antibody today. Chheck an MRI of the brain and compare with her images from last year to  determine if there is any subclinical progression.  We will need to  consider switching to an anti-CD20 agent if progression is occurring. Stay active and exercise as tolerated. Although she has an empty sella on MRI, she is not reporting much difficulties with headaches and does not have papilledema. Return in 6 months or sooner if there are new or worsening neurologic symptoms.   Caidence Higashi A. Epimenio Foot, MD, PhD, FAAN Certified in Neurology, Clinical Neurophysiology, Sleep Medicine, Pain Medicine and Neuroimaging Director, [facility]  [facility] [address] [place], [place] [address] [phone]

## 2022-12-06 LAB — CBC WITH DIFFERENTIAL/PLATELET
Basophils Absolute: 0.1 10*3/uL (ref 0.0–0.2)
Basos: 1 %
EOS (ABSOLUTE): 0.3 10*3/uL (ref 0.0–0.4)
Eos: 3 %
Hematocrit: 40.1 % (ref 34.0–46.6)
Hemoglobin: 13.2 g/dL (ref 11.1–15.9)
Immature Grans (Abs): 0.1 10*3/uL (ref 0.0–0.1)
Immature Granulocytes: 1 %
Lymphocytes Absolute: 4.8 10*3/uL — ABNORMAL HIGH (ref 0.7–3.1)
Lymphs: 44 %
MCH: 26.6 pg (ref 26.6–33.0)
MCHC: 32.9 g/dL (ref 31.5–35.7)
MCV: 81 fL (ref 79–97)
Monocytes Absolute: 1.2 10*3/uL — ABNORMAL HIGH (ref 0.1–0.9)
Monocytes: 11 %
Neutrophils Absolute: 4.4 10*3/uL (ref 1.4–7.0)
Neutrophils: 40 %
Platelets: 313 10*3/uL (ref 150–450)
RBC: 4.96 x10E6/uL (ref 3.77–5.28)
RDW: 14.3 % (ref 11.7–15.4)
WBC: 10.9 10*3/uL — ABNORMAL HIGH (ref 3.4–10.8)

## 2022-12-06 LAB — VITAMIN D 25 HYDROXY (VIT D DEFICIENCY, FRACTURES): Vit D, 25-Hydroxy: 14.1 ng/mL — ABNORMAL LOW (ref 30.0–100.0)

## 2022-12-10 ENCOUNTER — Other Ambulatory Visit: Payer: Self-pay | Admitting: Neurology

## 2022-12-10 MED ORDER — VITAMIN D (ERGOCALCIFEROL) 1.25 MG (50000 UNIT) PO CAPS: 50000.0000 [IU] | ORAL_CAPSULE | ORAL | 1 refills | Status: AC

## 2022-12-11 NOTE — Telephone Encounter (Signed)
[facility] results:  JCV negative antibody Index 0.13.

## 2023-01-18 ENCOUNTER — Encounter: Payer: Self-pay | Admitting: Neurology

## 2023-01-23 ENCOUNTER — Ambulatory Visit
Admission: RE | Admit: 2023-01-23 | Discharge: 2023-01-23 | Disposition: A | Discharge status: Home / Self Care | Payer: No Typology Code available for payment source | Source: Ambulatory Visit | Attending: Neurology | Admitting: Neurology

## 2023-01-23 DIAGNOSIS — G35 Multiple sclerosis: Secondary | ICD-10-CM | POA: Diagnosis not present

## 2023-01-23 MED ORDER — GADOPICLENOL 0.5 MMOL/ML IV SOLN
10.0000 mL | Freq: Once | INTRAVENOUS | Status: AC | PRN
  Administered 2023-01-23: 10 mL via INTRAVENOUS

## 2023-03-12 DIAGNOSIS — Z0289 Encounter for other administrative examinations: Secondary | ICD-10-CM

## 2023-03-19 ENCOUNTER — Telehealth: Payer: Self-pay | Admitting: *Deleted

## 2023-03-19 NOTE — Telephone Encounter (Signed)
Gave completed/signed accomodation/FMLA forms back to medical records to process for pt.

## 2023-03-26 ENCOUNTER — Telehealth: Payer: Self-pay | Admitting: *Deleted

## 2023-03-26 NOTE — Telephone Encounter (Signed)
Pt [employer] form faxed on 03/19/2023 [phone]

## 2023-06-19 ENCOUNTER — Encounter: Payer: Self-pay | Admitting: Neurology

## 2023-06-19 ENCOUNTER — Ambulatory Visit: Payer: No Typology Code available for payment source | Admitting: Neurology

## 2023-07-02 ENCOUNTER — Telehealth: Payer: Self-pay

## 2023-07-02 ENCOUNTER — Other Ambulatory Visit: Payer: Self-pay

## 2023-07-02 DIAGNOSIS — G35 Multiple sclerosis: Secondary | ICD-10-CM

## 2023-07-02 NOTE — Telephone Encounter (Signed)
Placed JCV in [facility] 07/02/2023

## 2023-07-03 LAB — CBC WITH DIFFERENTIAL/PLATELET
Basophils Absolute: 0 10*3/uL (ref 0.0–0.2)
Basos: 0 %
EOS (ABSOLUTE): 0.2 10*3/uL (ref 0.0–0.4)
Eos: 2 %
Hematocrit: 43 % (ref 34.0–46.6)
Hemoglobin: 13.8 g/dL (ref 11.1–15.9)
Immature Grans (Abs): 0 10*3/uL (ref 0.0–0.1)
Immature Granulocytes: 1 %
Lymphocytes Absolute: 3 10*3/uL (ref 0.7–3.1)
Lymphs: 47 %
MCH: 26.8 pg (ref 26.6–33.0)
MCHC: 32.1 g/dL (ref 31.5–35.7)
MCV: 84 fL (ref 79–97)
Monocytes Absolute: 0.7 10*3/uL (ref 0.1–0.9)
Monocytes: 11 %
Neutrophils Absolute: 2.4 10*3/uL (ref 1.4–7.0)
Neutrophils: 39 %
Platelets: 339 10*3/uL (ref 150–450)
RBC: 5.15 x10E6/uL (ref 3.77–5.28)
RDW: 13.6 % (ref 11.7–15.4)
WBC: 6.3 10*3/uL (ref 3.4–10.8)

## 2023-07-20 ENCOUNTER — Emergency Department (HOSPITAL_BASED_OUTPATIENT_CLINIC_OR_DEPARTMENT_OTHER)
Admission: EM | Admit: 2023-07-20 | Discharge: 2023-07-20 | Disposition: A | Discharge status: Home / Self Care | Payer: No Typology Code available for payment source | Attending: Emergency Medicine | Admitting: Emergency Medicine

## 2023-07-20 ENCOUNTER — Emergency Department (HOSPITAL_BASED_OUTPATIENT_CLINIC_OR_DEPARTMENT_OTHER): Payer: No Typology Code available for payment source

## 2023-07-20 ENCOUNTER — Encounter (HOSPITAL_BASED_OUTPATIENT_CLINIC_OR_DEPARTMENT_OTHER): Payer: Self-pay | Admitting: Emergency Medicine

## 2023-07-20 ENCOUNTER — Other Ambulatory Visit: Payer: Self-pay

## 2023-07-20 DIAGNOSIS — R0789 Other chest pain: Secondary | ICD-10-CM | POA: Insufficient documentation

## 2023-07-20 DIAGNOSIS — E876 Hypokalemia: Secondary | ICD-10-CM | POA: Insufficient documentation

## 2023-07-20 DIAGNOSIS — E871 Hypo-osmolality and hyponatremia: Secondary | ICD-10-CM | POA: Diagnosis not present

## 2023-07-20 DIAGNOSIS — Z20822 Contact with and (suspected) exposure to covid-19: Secondary | ICD-10-CM | POA: Insufficient documentation

## 2023-07-20 DIAGNOSIS — Z79899 Other long term (current) drug therapy: Secondary | ICD-10-CM | POA: Insufficient documentation

## 2023-07-20 DIAGNOSIS — I10 Essential (primary) hypertension: Secondary | ICD-10-CM | POA: Diagnosis not present

## 2023-07-20 DIAGNOSIS — D72829 Elevated white blood cell count, unspecified: Secondary | ICD-10-CM | POA: Insufficient documentation

## 2023-07-20 LAB — PREGNANCY, URINE: Preg Test, Ur: NEGATIVE

## 2023-07-20 LAB — CBC
HCT: 41.5 % (ref 36.0–46.0)
Hemoglobin: 13.4 g/dL (ref 12.0–15.0)
MCH: 26.5 pg (ref 26.0–34.0)
MCHC: 32.3 g/dL (ref 30.0–36.0)
MCV: 82.2 fL (ref 80.0–100.0)
Platelets: 358 10*3/uL (ref 150–400)
RBC: 5.05 MIL/uL (ref 3.87–5.11)
RDW: 14 % (ref 11.5–15.5)
WBC: 10.9 10*3/uL — ABNORMAL HIGH (ref 4.0–10.5)
nRBC: 0.2 % (ref 0.0–0.2)

## 2023-07-20 LAB — RESP PANEL BY RT-PCR (RSV, FLU A&B, COVID)  RVPGX2
Influenza A by PCR: NEGATIVE
Influenza B by PCR: NEGATIVE
Resp Syncytial Virus by PCR: NEGATIVE
SARS Coronavirus 2 by RT PCR: NEGATIVE

## 2023-07-20 LAB — BASIC METABOLIC PANEL
Anion gap: 7 (ref 5–15)
BUN: 7 mg/dL (ref 6–20)
CO2: 27 mmol/L (ref 22–32)
Calcium: 8.7 mg/dL — ABNORMAL LOW (ref 8.9–10.3)
Chloride: 100 mmol/L (ref 98–111)
Creatinine, Ser: 0.97 mg/dL (ref 0.44–1.00)
GFR, Estimated: >60 mL/min (ref >60)
Glucose, Bld: 95 mg/dL (ref 70–99)
Potassium: 3.4 mmol/L — ABNORMAL LOW (ref 3.5–5.1)
Sodium: 134 mmol/L — ABNORMAL LOW (ref 135–145)

## 2023-07-20 LAB — D-DIMER, QUANTITATIVE: D-Dimer, Quant: <0.27 ug{FEU}/mL (ref 0.00–0.50)

## 2023-07-20 LAB — TROPONIN I (HIGH SENSITIVITY): Troponin I (High Sensitivity): <2 ng/L (ref <18)

## 2023-07-20 MED ORDER — POTASSIUM CHLORIDE CRYS ER 20 MEQ PO TBCR
40.0000 meq | EXTENDED_RELEASE_TABLET | Freq: Once | ORAL | Status: AC
  Administered 2023-07-20: 40 meq via ORAL
  Filled 2023-07-20: qty 2

## 2023-07-20 NOTE — ED Provider Notes (Signed)
 [facility] Provider Note   CSN: [id number] Arrival date & time: 07/20/23  1229     History  Chief Complaint  Patient presents with   Chest Pain    Jodi Castillo is a 33 y.o. female with history of obesity, hypertension, MS, presents with concern for 4 days of pain in the center of her chest that is intermittent but worse with movement.  Pain is nonpleuritic, nonexertional.  Today she noted some tightness between her shoulder blades as well that is intermittent and worsens with movement.  She denies any injury.  Denies any shortness of dizziness, shortness of breath, nausea or vomiting, jaw or arm pain.   Chest Pain      Home Medications Prior to Admission medications   Medication Sig Start Date End Date Taking? Authorizing Provider  BLISOVI FE 1/20 1-20 MG-MCG tablet TAKE 1 TABLET BY MOUTH DAILY 10/30/22   Wyline Beady A, NP  hydrochlorothiazide (MICROZIDE) 12.5 MG capsule Take 12.5 mg by mouth daily. 12/22/20   [provider]  natalizumab (TYSABRI) 300 MG/15ML injection Inject into the vein.    [provider]  propranolol (INDERAL) 40 MG tablet Take 40 mg by mouth 2 (two) times daily. 11/13/20   [provider]  Vitamin D, Ergocalciferol, (DRISDOL) 1.25 MG (50000 UNIT) CAPS capsule Take 1 capsule (50,000 Units total) by mouth every 7 (seven) days. 12/10/22   Sater, Pearletha Furl, MD      Allergies    Patient has no known allergies.    Review of Systems   Review of Systems  Cardiovascular:  Positive for chest pain.    Physical Exam Updated Vital Signs BP (!) 154/104   Pulse 70   Temp 98.9 F (37.2 C)   Resp (!) 21   Ht 5\' 10"  (1.778 m)   Wt (!) 144.7 kg   LMP 07/11/2023 (Exact Date)   SpO2 99%   BMI 45.77 kg/m  Physical Exam Vitals and nursing note reviewed.  Constitutional:      General: She is not in acute distress.    Appearance: She is well-developed.  HENT:     Head:  Normocephalic and atraumatic.  Eyes:     Conjunctiva/sclera: Conjunctivae normal.  Cardiovascular:     Rate and Rhythm: Normal rate and regular rhythm.     Heart sounds: No murmur heard.    Comments: Radial pulses 2+ bilaterally Pulmonary:     Effort: Pulmonary effort is normal. No respiratory distress.     Breath sounds: Normal breath sounds.  Abdominal:     Palpations: Abdomen is soft.     Tenderness: There is no abdominal tenderness.  Musculoskeletal:        General: No swelling.     Cervical back: Neck supple.  Skin:    General: Skin is warm and dry.     Capillary Refill: Capillary refill takes less than 2 seconds.  Neurological:     Mental Status: She is alert.  Psychiatric:        Mood and Affect: Mood normal.     ED Results / Procedures / Treatments   Labs (all labs ordered are listed, but only abnormal results are displayed) Labs Reviewed  BASIC METABOLIC PANEL - Abnormal; Notable for the following components:      Result Value   Sodium 134 (*)    Potassium 3.4 (*)    Calcium 8.7 (*)    All other components within normal limits  CBC -  Abnormal; Notable for the following components:   WBC 10.9 (*)    All other components within normal limits  RESP PANEL BY RT-PCR (RSV, FLU A&B, COVID)  RVPGX2  PREGNANCY, URINE  D-DIMER, QUANTITATIVE  TROPONIN I (HIGH SENSITIVITY)    EKG EKG Interpretation Date/Time:  Friday July 20 2023 12:38:23 EST Ventricular Rate:  72 PR Interval:  169 QRS Duration:  83 QT Interval:  380 QTC Calculation: 416 R Axis:   50  Text Interpretation: Sinus rhythm Low voltage, precordial leads Nonspecific T wave abnormality No previous tracing Confirmed by Cathren Laine ([id number]) on 07/20/2023 12:42:23 PM  Radiology DG Chest 2 View Result Date: 07/20/2023 CLINICAL DATA:  Chest pain. EXAM: CHEST - 2 VIEW COMPARISON:  02/17/2021. FINDINGS: Bilateral lung fields are clear. Bilateral costophrenic angles are clear. Normal cardio-mediastinal  silhouette. No acute osseous abnormalities. The soft tissues are within normal limits. IMPRESSION: No active cardiopulmonary disease. Electronically Signed   By: Jules Schick M.D.   On: 07/20/2023 13:56    Procedures Procedures    Medications Ordered in ED Medications  potassium chloride SA (KLOR-CON M) CR tablet 40 mEq (40 mEq Oral Given 07/20/23 1414)    ED Course/ Medical Decision Making/ A&P             HEART Score: 1                    Medical Decision Making Amount and/or Complexity of Data Reviewed Labs: ordered. Radiology: ordered.  Risk Prescription drug management.     Differential diagnosis includes but is not limited to ACS, arrhythmia, aortic aneurysm, pericarditis, myocarditis, pericardial effusion, cardiac tamponade, musculoskeletal pain, GERD, Boerhaave's syndrome, DVT/PE, pneumonia, pleural effusion   ED Course:  Patient very well-appearing, stable vital signs aside from a elevated blood pressure upon arrival at 175/83 but improved to 154/104.  She reports the pain in her center chest has been intermittent for the past week, worsens with movement.  Unable to reproduce pain with palpation. I Ordered, and personally interpreted labs.  The pertinent results include:   Troponin under 2 D-dimer within normal limits CBC with slight leukocytosis of 10.9, otherwise unremarkable BMP with hypokalemia at 3.4, hypocalcemia 8.7, hyponatremia 134 Flu, COVID, RSV negative Pregnancy negative Low concern for ACS at this time given troponin remains stable with initial troponin under 2 and pain has been ongoing for 4 days, pain non-exertional, HEART score of 1. Chest x-ray without any acute abnormality. No concern for DVT or PE at this time given d-dimer within normal limits. Although patient reports some pain between her shoulder pain, this is not a ripping/tearing pain, no mediastinal widening on chest x-ray, radial pulses 2+ bilaterally, low concern for any aortic dissection  at this time.  Unclear as to the etiology of patient's pain at this time, but low concern for any emergent pathology.  Patient stable and appropriate for discharge at this time.   Impression: Atypical chest pain Hypokalemia Hypocalcemia  Disposition:  The patient was discharged home with instructions to increase dietary intake of calcium and potassium.  Discussed with patient that her blood pressure was elevated today, and that she needs to follow-up with her PCP within the next 2 weeks for a recheck of her symptoms and for a recheck of her blood pressure. Return precautions given.  Imaging Studies ordered: I ordered imaging studies including chest x-ray I independently visualized the imaging with scope of interpretation limited to determining acute life threatening conditions related to emergency care.  Imaging showed no acute abnormalities I agree with the radiologist interpretation   Cardiac Monitoring: / EKG: The patient was maintained on a cardiac monitor.  I personally viewed and interpreted the cardiac monitored which showed an underlying rhythm of: normal sinus rhythm, no ST elevations               Final Clinical Impression(s) / ED Diagnoses Final diagnoses:  Atypical chest pain  Hypocalcemia  Hypokalemia    Rx / DC Orders ED Discharge Orders     None         Arabella Merles, PA-C 07/20/23 1440    Cathren Laine, MD 07/22/23 [phone]

## 2023-07-20 NOTE — Discharge Instructions (Addendum)
Your workup today is reassuring. It is very unlikely that your pain is due to an issue in your heart.  Your cardiac enzyme (troponin) was normal today. Your EKG which is a measure of the heart's electrical activity and rhythm is normal today. These would both show abnormalities if you were having a heart attack.  Your chest x-ray is normal today.  Your COVID, flu, and RSV are negative today.  Your D-dimer is normal today which means that a blood clot is very unlikely.  Your blood counts were normal today besides a slightly elevated white blood cell count which appears to be your baseline.  Your calcium, sodium, and potassium values are slightly lower today.  Please increase calcium intake at home with foods such as dairy products.  Please increase potassium intake such as bananas and avocados.  Your blood pressure was elevated today at 175/83.  Please continue taking your home blood pressure medications as prescribed.  Please follow-up with a PCP within the next 2 weeks for a recheck of your symptoms and blood pressure.  Return to the ER if you have any shortness of breath, difficulty breathing, worsening chest pain, dizziness, jaw pain, left arm or shoulder pain, abdominal pain, unexplained fever, any other new or concerning symptoms.

## 2023-07-20 NOTE — ED Triage Notes (Signed)
Pt POV steady gait- c/o chest pain, worse with movement, x3 days. Today pain is radiating between shoulder blades.  Denies injury or heavy lifting.

## 2023-08-14 ENCOUNTER — Telehealth: Payer: Self-pay | Admitting: *Deleted

## 2023-08-14 NOTE — Telephone Encounter (Signed)
 Called pt since she is past due for f/u. Scheduled appt for 08/28/23 at 9am with Dr. Epimenio Foot. Asked she check in by 8:30am, bring updated insurance and med list. She verbalized understanding and appreciation.  She NS 06/19/23 appt. Last seen 12/05/22. Last JCV 07/02/23 negative, index: 0.16.

## 2023-08-28 ENCOUNTER — Encounter: Payer: Self-pay | Admitting: Neurology

## 2023-08-28 ENCOUNTER — Ambulatory Visit: Admitting: Neurology

## 2023-08-28 VITALS — BP 143/92 | HR 73 | Ht 70.0 in | Wt 322.0 lb

## 2023-08-28 DIAGNOSIS — G35 Multiple sclerosis: Secondary | ICD-10-CM | POA: Diagnosis not present

## 2023-08-28 DIAGNOSIS — E236 Other disorders of pituitary gland: Secondary | ICD-10-CM

## 2023-08-28 DIAGNOSIS — Z79899 Other long term (current) drug therapy: Secondary | ICD-10-CM

## 2023-08-28 DIAGNOSIS — R200 Anesthesia of skin: Secondary | ICD-10-CM

## 2023-08-28 NOTE — Progress Notes (Signed)
 [facility]  PATIENT: Jodi Castillo DOB: 05-05-1991  REFERRING DOCTOR OR PCP:  Maud Deed, PA-C SOURCE: Patient, notes from ED/hospital, imaging and lab reports reviewed, MRI images personally reviewed.  _________________________________   HISTORICAL  CHIEF COMPLAINT:  Chief Complaint  Patient presents with   Follow-up    Pt in room 11.Alone. Here for MS follow up. DMT: Tysabri. Pt last Tysabri was 08/27/23. Pt has noticed last week numbness in right foot and leg.    HISTORY OF PRESENT ILLNESS:  Jodi Castillo is a 33 y.o. woman with  MS.    UPDATE 12/05/2022: She started Tysabri in [place] 2022 and tolerate it well.  .  She has no side effects and feels good.   JCV was negative 2/03/204 at 0.18  She denies any new neurologic symptoms.   No exacerbations.  She denies difficulties with gait, balance, strength.  She notes some tingling and numbness on the right.  No dysesthesia..    She often uses the bannister on stairsand feels balance just slightly off.    She can easily walk 3 miles and keeps up with others.     There is very slight visual change on the right.  Reading out of that eye is blurry.  Colors are symmetric.    She has urinary urgency and rare urge or stress incontinence.    No nocturia.  She denies much fatigue.  She usually sleeps well.  No difficulties with cognition or mood.      She has hypertension and is on propranolol ad HCTZ.        MRI also showed an empty sella.    She denies headaches and no major visual change.   She sees ophthalmology annually   .  No diplopia.   She does not feels lightheaded upon standing.      MS History: She had the onset of left foot and leg numbness around 01/05/2021 that increased and involved her left arm over the next 2 days.   She had fairly mild left facial numbness.   She did not note any definite weakness but she noted that her left leg gave out ad she stumbled a few times.     She went to  Urgent Care because she though she had a pinched nerve and was prescribed a standard steroid pack.   A couple days later, as symptoms worsened, she also noted some leg pain, and she went to the ED.   She had brain and cervical spine imaging showing changes c/w MS.   Two white matter lesions enhanced after contrast c/w acute demyelinating lesions.   There were no lesions in the spinal cord though 1 is seen in the medulla.  She received IV steroids x 1 gram once and then was discharged on 625 mg prednisone x 2 more days.      Numbness resolved over the next few days and she currently feels back to baseline.  Imaging: MRI of the brain 01/12/2021 showed T2/flair hyperintense foci in the hemispheres, some of the periventricular white matter and 1 in the medial left parietal lobe in the juxtacortical white matter.  A focus in the right parietal centrum semiovale enhanced after contrast was administered consistent with an acute demyelinating plaque.  Additionally, a focus adjacent to the right trigone had subtle enhancement.  A nonenhancing focus was also seen in the right medulla.   She also has an enlarged sella turcica and flattened pituitary.    Optic nerve sheaths  were normal.     MRI of the cervical spine 01/12/2021 showed a normal spinal cord.  The focus in the medulla lobe is redemonstrated.  Normal enhancement pattern.  MRI brain 12/14/21 showed Scattered T2/FLAIR hyperintense foci in the cerebral hemispheres, predominantly in the periventricular white matter, and in the right medulla.  These are consistent with chronic demyelinating plaque associated with multiple sclerosis.  None of the foci appear to be acute.  They do not enhance.  Compared to the MRI from 01/12/2021, there were no new lesions. Pituitary tissue is thinned within a mildly enlarged sella turcica consistent with a partially empty sella.  This is usually incidental but can also be seen with elevated intracranial pressure.  It is stable compared  to the 2022 MRI.  MRI brai 01/23/2023 was unchanged.     Laboratory tests:: Beta hCG, CBC and BMP were noncontributory 01/12/2021.  Vitamin B12 and TSH were normal 01/07/2021.  REVIEW OF SYSTEMS: Constitutional: No fevers, chills, sweats, or change in appetite Eyes: No visual changes, double vision, eye pain Ear, nose and throat: No hearing loss, ear pain, nasal congestion, sore throat Cardiovascular: No chest pain, palpitations Respiratory:  No shortness of breath at rest or with exertion.   No wheezes GastrointestinaI: No nausea, vomiting, diarrhea, abdominal pain, fecal incontinence Genitourinary:  No dysuria, urinary retention or frequency.  No nocturia. Musculoskeletal:  No neck pain, back pain Integumentary: No rash, pruritus, skin lesions Neurological: as above Psychiatric: No depression at this time.  No anxiety Endocrine: No palpitations, diaphoresis, change in appetite, change in weigh or increased thirst Hematologic/Lymphatic:  No anemia, purpura, petechiae. Allergic/Immunologic: No itchy/runny eyes, nasal congestion, recent allergic reactions, rashes  ALLERGIES: No Known Allergies  HOME MEDICATIONS:  Current Outpatient Medications:    BLISOVI FE 1/20 1-20 MG-MCG tablet, TAKE 1 TABLET BY MOUTH DAILY, Disp: 84 tablet, Rfl: 0   cholecalciferol (VITAMIN D3) 25 MCG (1000 UNIT) tablet, Take 1,000 Units by mouth daily., Disp: , Rfl:    hydrochlorothiazide (MICROZIDE) 12.5 MG capsule, Take 12.5 mg by mouth daily., Disp: , Rfl:    natalizumab (TYSABRI) 300 MG/15ML injection, Inject into the vein., Disp: , Rfl:    propranolol (INDERAL) 40 MG tablet, Take 40 mg by mouth 2 (two) times daily., Disp: , Rfl:    Vitamin D, Ergocalciferol, (DRISDOL) 1.25 MG (50000 UNIT) CAPS capsule, Take 1 capsule (50,000 Units total) by mouth every 7 (seven) days. (Patient not taking: Reported on 08/28/2023), Disp: 13 capsule, Rfl: 1  PAST MEDICAL HISTORY: Past Medical History:  Diagnosis Date    Hypertension    Irregular periods/menstrual cycles    Multiple sclerosis (HCC) 01/12/2021    PAST SURGICAL HISTORY: History reviewed. No pertinent surgical history.  FAMILY HISTORY: Family History  Problem Relation Age of Onset   Hypertension Mother    Stroke Mother    Psoriasis Mother    Crohn's disease Sister    Diabetes Maternal Aunt    Atrial fibrillation Maternal Aunt    Asthma Maternal Aunt    Cancer Maternal Grandmother        Pancreatic    SOCIAL HISTORY:  Social History   Socioeconomic History   Marital status: Single    Spouse name: Not on file   Number of children: Not on file   Years of education: Not on file   Highest education level: Some college, no degree  Occupational History   Not on file  Tobacco Use   Smoking status: Former    Types:  Cigarettes   Smokeless tobacco: Never  Vaping Use   Vaping status: Never Used  Substance and Sexual Activity   Alcohol use: Yes    Comment: Occas   Drug use: No   Sexual activity: Yes    Birth control/protection: None  Other Topics Concern   Not on file  Social History Narrative   Lives w boyfriend   Left handed   Caffeine: 2 cups of coffee in the AM   Social Drivers of Health   Financial Resource Strain: Low Risk  (02/12/2023)   Received from [facility]   Overall Financial Resource Strain (CARDIA)    Difficulty of Paying Living Expenses: Not very hard  Food Insecurity: No Food Insecurity (02/12/2023)   [facility]   Hunger Vital Sign    Worried About Running Out of Food in the Last Year: Never true    Ran Out of Food in the Last Year: Never true  Transportation Needs: No Transportation Needs (02/12/2023)   Received from [facility] - Transportation    Lack of Transportation (Medical): No    Lack of Transportation (Non-Medical): No  Physical Activity: Insufficiently Active (02/12/2023)   Received from [facility]   Exercise Vital Sign    Days of Exercise per Week: 2  days    Minutes of Exercise per Session: 30 min  Stress: No Stress Concern Present (02/12/2023)   Received from [facility] - Occupational Stress Questionnaire    Feeling of Stress : Only a little  Social Connections: Moderately Integrated (02/12/2023)   Received from [facility]   Social Network    How would you rate your social network (family, work, friends)?: Adequate participation with social networks  Intimate Partner Violence: Not At Risk (02/12/2023)   Received from [facility]   HITS    Over the last 12 months how often did your partner physically hurt you?: Never    Over the last 12 months how often did your partner insult you or talk down to you?: Never    Over the last 12 months how often did your partner threaten you with physical harm?: Never    Over the last 12 months how often did your partner scream or curse at you?: Never     PHYSICAL EXAM  Vitals:   08/28/23 0836  BP: (!) 143/92  Pulse: 73  Weight: (!) 322 lb (146.1 kg)  Height: 5\' 10"  (1.778 m)    Note:  She had not yet taken BP medication  Body mass index is 46.2 kg/m.  No results found.    General: The patient is well-developed and well-nourished and in no acute distress  HEENT:  Head is [place]/AT.  Sclera are anicteric.  Funduscopic exam shows normal optic discs and retinal vessels.  Neck: Good range of motion  Skin: Extremities are without rash or  edema.  Musculoskeletal:  Back is nontender  Neurologic Exam  Mental status: The patient is alert and oriented x 3 at the time of the examination. The patient has apparent normal recent and remote memory, with an apparently normal attention span and concentration ability.   Speech is normal.  Cranial nerves: Extraocular movements are full.  Facial strength and sensation was normal.. No obvious hearing deficits are noted.  Motor:  Muscle bulk is normal.   Tone is normal. Strength is  5 / 5 in all 4  extremities.   Sensory: She reports mildly reduced  sensation to touch and temperature on the left..  Coordination: Cerebellar testing reveals good finger-nose-finger and heel-to-shin bilaterally.  Gait and station: Station is normal.   Gait is normal. Tandem gait is normal. Romberg is negative.   Reflexes: Deep tendon reflexes are symmetric and normal bilaterally.        DIAGNOSTIC DATA (LABS, IMAGING, TESTING) - I reviewed patient records, labs, notes, testing and imaging myself where available.  Lab Results  Component Value Date   WBC 10.9 (H) 07/20/2023   HGB 13.4 07/20/2023   HCT 41.5 07/20/2023   MCV 82.2 07/20/2023   PLT 358 07/20/2023      Component Value Date/Time   NA 134 (L) 07/20/2023 1246   K 3.4 (L) 07/20/2023 1246   CL 100 07/20/2023 1246   CO2 27 07/20/2023 1246   GLUCOSE 95 07/20/2023 1246   BUN 7 07/20/2023 1246   CREATININE 0.97 07/20/2023 1246   CALCIUM 8.7 (L) 07/20/2023 1246   PROT 6.9 04/06/2021 0942   AST 12 04/06/2021 0942   ALT 14 04/06/2021 0942   BILITOT 0.3 04/06/2021 0942   GFRNONAA >60 07/20/2023 1246   Lab Results  Component Value Date   CHOL 129 06/24/2015   HDL 26 (L) 06/24/2015   LDLCALC 75 06/24/2015   TRIG 140 06/24/2015   CHOLHDL 5.0 06/24/2015   No results found for: "HGBA1C" No results found for: "VITAMINB12" Lab Results  Component Value Date   TSH 1.536 06/24/2015       ASSESSMENT AND PLAN  Multiple sclerosis (HCC)  High risk medication use  Numbness  Empty sella (HCC)  Continue Tysabri.  We will check the JCV antibody today. Chheck an MRI of the brain and compare with her images from last year to determine if there is any subclinical progression.  We will need to consider switching to an anti-CD20 agent if progression is occurring. Stay active and exercise as tolerated. Although she has an empty sella on MRI, she is not reporting much difficulties with headaches and does not have papilledema. Return in 6  months or sooner if there are new or worsening neurologic symptoms.   Jodi Castillo A. Epimenio Foot, MD, PhD, FAAN Certified in Neurology, Clinical Neurophysiology, Sleep Medicine, Pain Medicine and Neuroimaging Director, [facility] at [facility]  [facility] [address] [place], [place] [address] [phone]

## 2023-10-29 ENCOUNTER — Encounter (HOSPITAL_BASED_OUTPATIENT_CLINIC_OR_DEPARTMENT_OTHER): Payer: Self-pay

## 2023-10-29 ENCOUNTER — Emergency Department (HOSPITAL_BASED_OUTPATIENT_CLINIC_OR_DEPARTMENT_OTHER)
Admission: EM | Admit: 2023-10-29 | Discharge: 2023-10-29 | Disposition: A | Discharge status: Home / Self Care | Attending: Emergency Medicine | Admitting: Emergency Medicine

## 2023-10-29 ENCOUNTER — Other Ambulatory Visit: Payer: Self-pay

## 2023-10-29 DIAGNOSIS — L02411 Cutaneous abscess of right axilla: Secondary | ICD-10-CM | POA: Insufficient documentation

## 2023-10-29 MED ORDER — FLUCONAZOLE 150 MG PO TABS: 150.0000 mg | ORAL_TABLET | Freq: Once | ORAL | 0 refills | Status: AC

## 2023-10-29 MED ORDER — LIDOCAINE-EPINEPHRINE (PF) 2 %-1:200000 IJ SOLN
10.0000 mL | Freq: Once | INTRAMUSCULAR | Status: AC
  Administered 2023-10-29: 10 mL
  Filled 2023-10-29: qty 20

## 2023-10-29 MED ORDER — DOXYCYCLINE HYCLATE 100 MG PO CAPS: 100.0000 mg | ORAL_CAPSULE | Freq: Two times a day (BID) | ORAL | 0 refills | Status: AC

## 2023-10-29 NOTE — ED Triage Notes (Signed)
 Pt reports large non draining abscess right axilla area since last Monday

## 2023-10-29 NOTE — ED Provider Notes (Addendum)
 [facility] AT [facility] Provider Note   CSN: [id number] Arrival date & time: 10/29/23  7829     History  Chief Complaint  Patient presents with   Abscess    Jodi Castillo is a 33 y.o. female.   Abscess Patient presents with right axilla abscess.  Has had for a week now.  No fevers.  Does have history of MS and is on treatment for it.  No chills.  Has had abscesses before but has not had them.  She is not diabetic.     Home Medications Prior to Admission medications   Medication Sig Start Date End Date Taking? Authorizing Provider  doxycycline (VIBRAMYCIN) 100 MG capsule Take 1 capsule (100 mg total) by mouth 2 (two) times daily. 10/29/23  Yes Mozell Arias, MD  BLISOVI FE 1/20 1-20 MG-MCG tablet TAKE 1 TABLET BY MOUTH DAILY 10/30/22   Antonio Klinefelter A, NP  cholecalciferol (VITAMIN D3) 25 MCG (1000 UNIT) tablet Take 1,000 Units by mouth daily.    [provider]  hydrochlorothiazide (MICROZIDE) 12.5 MG capsule Take 12.5 mg by mouth daily. 12/22/20   [provider]  natalizumab  (TYSABRI ) 300 MG/15ML injection Inject into the vein.    [provider]  propranolol (INDERAL) 40 MG tablet Take 40 mg by mouth 2 (two) times daily. 11/13/20   [provider]  Vitamin D , Ergocalciferol , (DRISDOL ) 1.25 MG (50000 UNIT) CAPS capsule Take 1 capsule (50,000 Units total) by mouth every 7 (seven) days. Patient not taking: Reported on 08/28/2023 12/10/22   Jorie Newness, MD      Allergies    Patient has no known allergies.    Review of Systems   Review of Systems  Physical Exam Updated Vital Signs BP (!) 137/98   Pulse 85   Temp 98.7 F (37.1 C)   Resp 18   LMP 10/25/2023   SpO2 100%  Physical Exam Vitals reviewed.  Cardiovascular:     Rate and Rhythm: Normal rate.  Skin:    Comments: Right axilla has large fluctuant swelling.  Approximately 7 cm x 3 cm.  On the anterior and posterior aspect there are more  swollen areas.  There is surrounding and posterior induration.  Neurological:     Mental Status: She is alert.     ED Results / Procedures / Treatments   Labs (all labs ordered are listed, but only abnormal results are displayed) Labs Reviewed  AEROBIC CULTURE W GRAM STAIN (SUPERFICIAL SPECIMEN)    EKG None  Radiology No results found.  Procedures Procedures    Medications Ordered in ED Medications  lidocaine-EPINEPHrine (XYLOCAINE W/EPI) 2 %-1:200000 (PF) injection 10 mL (10 mLs Infiltration Given by Other 10/29/23 5621)    ED Course/ Medical Decision Making/ A&P                                 Medical Decision Making Risk Prescription drug management.   Patient is right axillary abscess.  Began to spontaneously drain while in the ER.  Does have surrounding cellulitis.  Drained successfully with no more purulence expressed.  Does have 2 openings.  With free-flowing drainage do not think any packing at this time.  Will give antibiotics due to her comorbidities.  Can recheck in a few days.  Culture also had been sent.         Final Clinical Impression(s) / ED Diagnoses Final diagnoses:  Abscess of axilla, right    Rx / DC Orders ED Discharge Orders          Ordered    doxycycline (VIBRAMYCIN) 100 MG capsule  2 times daily        10/29/23 0800              Mozell Arias, MD 10/29/23 0800    Mozell Arias, MD 10/29/23 [phone]

## 2023-11-03 LAB — AEROBIC CULTURE W GRAM STAIN (SUPERFICIAL SPECIMEN)

## 2023-11-04 ENCOUNTER — Telehealth (HOSPITAL_BASED_OUTPATIENT_CLINIC_OR_DEPARTMENT_OTHER): Payer: Self-pay | Admitting: *Deleted

## 2023-11-04 NOTE — Telephone Encounter (Signed)
 Post ED Visit - Positive Culture Follow-up: Unsuccessful Patient Follow-up  Culture assessed and recommendations reviewed by:  [x]  Dionicio Fray, Pharm.D. []  Skeet Duke, Pharm.D., BCPS AQ-ID []  Leslee Rase, Pharm.D., BCPS []  Garland Junk, Pharm.D., BCPS []  [place], [address].D., BCPS, AAHIVP []  Alcide Aly, Pharm.D., BCPS, AAHIVP []  Alejo Hurter, PharmD []  Thomasine Flick, PharmD, BCPS  Positive aerobic culture  []  Patient discharged without antimicrobial prescription and treatment is now indicated [x]  Organism is resistant to prescribed ED discharge antimicrobial []  Patient with positive blood cultures  Plan: Continue Doxy as prescrived.  Add Cefpodoxime 400mg  BID x 7 days per Elise Guile  Unable to contact patient, letter will be sent to address on file  Jodi Castillo 11/04/2023, 3:16 PM

## 2023-11-04 NOTE — Progress Notes (Signed)
 ED Antimicrobial Stewardship Positive Culture Follow Up   Jodi Castillo is an 33 y.o. female who presented to [facility] on @ADMITDT @ with a chief complaint of  Chief Complaint  Patient presents with   Abscess    Recent Results (from the past 720 hours)  Aerobic Culture w Gram Stain (superficial specimen)     Status: None   Collection Time: 10/29/23  7:45 AM   Specimen: Axilla  Result Value Ref Range Status   Specimen Description   Final    AXILLA RIGHT Performed at [facility], [address] [place], [place] [address]    Special Requests   Final    NONE Performed at [facility], [address] [place], [place] [address]    Gram Stain   Final    ABUNDANT WBC PRESENT, PREDOMINANTLY PMN ABUNDANT GRAM POSITIVE COCCI IN PAIRS    Culture   Final    FEW KLEBSIELLA AEROGENES ABUNDANT STREPTOCOCCUS SPECIES MODERATE DIPHTHEROIDS(CORYNEBACTERIUM SPECIES) Standardized susceptibility testing for this organism is not available. Performed at [facility], [address] [place], [place] [address]    Report Status 11/03/2023 FINAL  Final   Organism ID, Bacteria KLEBSIELLA AEROGENES  Final   Organism ID, Bacteria STREPTOCOCCUS SPECIES  Final      Susceptibility   Klebsiella aerogenes - MIC*    CEFEPIME <=0.12 SENSITIVE Sensitive     CEFTAZIDIME <=1 SENSITIVE Sensitive     CEFTRIAXONE <=0.25 SENSITIVE Sensitive     CIPROFLOXACIN <=0.25 SENSITIVE Sensitive     GENTAMICIN <=1 SENSITIVE Sensitive     IMIPENEM 1 SENSITIVE Sensitive     TRIMETH/SULFA <=20 SENSITIVE Sensitive     PIP/TAZO <=4 SENSITIVE Sensitive ug/mL    * FEW KLEBSIELLA AEROGENES   Streptococcus species - MIC*    PENICILLIN <=0.06 SENSITIVE Sensitive     CEFTRIAXONE 0.25 SENSITIVE Sensitive     ERYTHROMYCIN <=0.12 SENSITIVE Sensitive     LEVOFLOXACIN 2 SENSITIVE Sensitive     VANCOMYCIN 0.5 SENSITIVE Sensitive     * ABUNDANT STREPTOCOCCUS SPECIES    [x]  Treated with Doxycycline ,  organism resistant to prescribed antimicrobial  Given purulence I still think that staph should be considered especially given there are several species in the culture above.  Continue taking doxycycline  as prescribed ADD: Cefpodoxime 400 mg every 12 hours x 7 days (Qty 14; Refills 0)  ED Provider: Lars Poche, PharmD, BCPS 11/04/2023 10:21 AM ED Clinical Pharmacist -  [phone]

## 2023-12-18 ENCOUNTER — Other Ambulatory Visit (INDEPENDENT_AMBULATORY_CARE_PROVIDER_SITE_OTHER): Payer: Self-pay

## 2023-12-18 ENCOUNTER — Other Ambulatory Visit: Payer: Self-pay | Admitting: *Deleted

## 2023-12-18 ENCOUNTER — Telehealth: Payer: Self-pay | Admitting: *Deleted

## 2023-12-18 DIAGNOSIS — G35 Multiple sclerosis: Secondary | ICD-10-CM

## 2023-12-18 DIAGNOSIS — Z79899 Other long term (current) drug therapy: Secondary | ICD-10-CM

## 2023-12-18 DIAGNOSIS — Z0289 Encounter for other administrative examinations: Secondary | ICD-10-CM

## 2023-12-18 NOTE — Telephone Encounter (Signed)
 Placed JCV lab in [facility] lock box for routine lab pick up. Results pending.

## 2023-12-19 LAB — CBC WITH DIFFERENTIAL/PLATELET
Basophils Absolute: 0.1 x10E3/uL (ref 0.0–0.2)
Basos: 1 %
EOS (ABSOLUTE): 0.3 x10E3/uL (ref 0.0–0.4)
Eos: 3 %
Hematocrit: 42.4 % (ref 34.0–46.6)
Hemoglobin: 13.7 g/dL (ref 11.1–15.9)
Immature Grans (Abs): 0 x10E3/uL (ref 0.0–0.1)
Immature Granulocytes: 0 %
Lymphocytes Absolute: 4.8 x10E3/uL — ABNORMAL HIGH (ref 0.7–3.1)
Lymphs: 49 %
MCH: 27.5 pg (ref 26.6–33.0)
MCHC: 32.3 g/dL (ref 31.5–35.7)
MCV: 85 fL (ref 79–97)
Monocytes Absolute: 1 x10E3/uL — ABNORMAL HIGH (ref 0.1–0.9)
Monocytes: 11 %
Neutrophils Absolute: 3.5 x10E3/uL (ref 1.4–7.0)
Neutrophils: 36 %
Platelets: 329 x10E3/uL (ref 150–450)
RBC: 4.98 x10E6/uL (ref 3.77–5.28)
RDW: 14 % (ref 11.7–15.4)
WBC: 9.8 x10E3/uL (ref 3.4–10.8)

## 2024-02-01 ENCOUNTER — Encounter: Payer: Self-pay | Admitting: Neurology

## 2024-03-03 ENCOUNTER — Encounter: Payer: Self-pay | Admitting: Neurology

## 2024-03-17 ENCOUNTER — Ambulatory Visit: Payer: Self-pay | Admitting: Adult Health

## 2024-06-19 ENCOUNTER — Telehealth: Payer: Self-pay | Admitting: *Deleted

## 2024-06-19 ENCOUNTER — Other Ambulatory Visit: Payer: Self-pay | Admitting: *Deleted

## 2024-06-19 DIAGNOSIS — Z79899 Other long term (current) drug therapy: Secondary | ICD-10-CM

## 2024-06-19 DIAGNOSIS — G35D Multiple sclerosis, unspecified: Secondary | ICD-10-CM

## 2024-06-19 NOTE — Telephone Encounter (Signed)
 The pt canceled her October 2025 appt. She did not reschedule and is overdue. Please call patient and schedule her with Harlene NP for next available. Cannot be Monday 1/26 due to infusion that day.

## 2024-07-02 ENCOUNTER — Encounter: Payer: Self-pay | Admitting: Adult Health

## 2024-07-02 ENCOUNTER — Other Ambulatory Visit: Payer: Self-pay

## 2024-07-02 ENCOUNTER — Telehealth: Payer: Self-pay | Admitting: Adult Health

## 2024-07-02 ENCOUNTER — Ambulatory Visit: Admitting: Adult Health

## 2024-07-02 VITALS — BP 136/88 | HR 91 | Ht 70.0 in | Wt 328.2 lb

## 2024-07-02 DIAGNOSIS — R3915 Urgency of urination: Secondary | ICD-10-CM | POA: Diagnosis not present

## 2024-07-02 DIAGNOSIS — G35B0 Primary progressive multiple sclerosis, unspecified: Secondary | ICD-10-CM | POA: Diagnosis not present

## 2024-07-02 DIAGNOSIS — Z79899 Other long term (current) drug therapy: Secondary | ICD-10-CM

## 2024-07-02 DIAGNOSIS — G35D Multiple sclerosis, unspecified: Secondary | ICD-10-CM

## 2024-07-02 DIAGNOSIS — R339 Retention of urine, unspecified: Secondary | ICD-10-CM

## 2024-07-02 NOTE — Telephone Encounter (Signed)
 [facility]: [id number] exp. 07/02/24-09/29/24 sent to GI [phone]

## 2024-07-02 NOTE — Progress Notes (Signed)
 "  [facility]  PATIENT: Jodi Castillo DOB: February 06, 1991  REFERRING DOCTOR OR PCP:  Verla Reus, PA-C SOURCE: Patient, notes from ED/hospital, imaging and lab reports reviewed, MRI images personally reviewed.  _________________________________   HISTORICAL  CHIEF COMPLAINT:  Chief Complaint  Patient presents with   RM 3    Patient is here alone for MS follow-up - no concerns     HISTORY OF PRESENT ILLNESS:  Doxie Augenstein is a 34 y.o. woman with  MS.    UPDATE 07/02/2024 JM: Prior visit with Dr. Vear 08/28/2023.  She started Tysabri  in Septembr 2022 and continues to tolerate it well.   She has no side effects and feels good.   JCV was negative 11/2023 at 0.16  She denies any new neurologic symptoms.   No exacerbations.  She denies difficulties with strength.  She notes some tingling and numbness on the right but denies worsening.  No dysesthesia..    She often uses the bannister on stairsand feels balance just slightly off.  No use of AD, no recent falls  Previously reported change in OD vision. Seen by ophtho with good exam per patient. Reports improvement of vision and not currently having any vision issues.   She has urinary urgency and rare urge or stress incontinence, denies worsening. Has not previously tried medication for this, she would be interested possibly in the future if it should worsen.  No nocturia.  She denies much fatigue.  She usually sleeps well.  No difficulties with cognition or mood.      She has hypertension and is on propranolol ad HCTZ.        MRI also showed an empty sella.    She denies headaches and no major visual change.   She sees ophthalmology annually.  No diplopia.      MS History: She had the onset of left foot and leg numbness around 01/05/2021 that increased and involved her left arm over the next 2 days.   She had fairly mild left facial numbness.   She did not note any definite weakness but she noted that her left  leg gave out ad she stumbled a few times.     She went to Urgent Care because she though she had a pinched nerve and was prescribed a standard steroid pack.   A couple days later, as symptoms worsened, she also noted some leg pain, and she went to the ED.   She had brain and cervical spine imaging showing changes c/w MS.   Two white matter lesions enhanced after contrast c/w acute demyelinating lesions.   There were no lesions in the spinal cord though 1 is seen in the medulla.  She received IV steroids x 1 gram once and then was discharged on 625 mg prednisone  x 2 more days.      Numbness resolved over the next few days and she currently feels back to baseline.  Imaging: MRI of the brain 01/12/2021 showed T2/flair hyperintense foci in the hemispheres, some of the periventricular white matter and 1 in the medial left parietal lobe in the juxtacortical white matter.  A focus in the right parietal centrum semiovale enhanced after contrast was administered consistent with an acute demyelinating plaque.  Additionally, a focus adjacent to the right trigone had subtle enhancement.  A nonenhancing focus was also seen in the right medulla.   She also has an enlarged sella turcica and flattened pituitary.    Optic nerve sheaths were normal.  MRI of the cervical spine 01/12/2021 showed a normal spinal cord.  The focus in the medulla lobe is redemonstrated.  Normal enhancement pattern.  MRI brain 12/14/21 showed Scattered T2/FLAIR hyperintense foci in the cerebral hemispheres, predominantly in the periventricular white matter, and in the right medulla.  These are consistent with chronic demyelinating plaque associated with multiple sclerosis.  None of the foci appear to be acute.  They do not enhance.  Compared to the MRI from 01/12/2021, there were no new lesions. Pituitary tissue is thinned within a mildly enlarged sella turcica consistent with a partially empty sella.  This is usually incidental but can also be seen  with elevated intracranial pressure.  It is stable compared to the 2022 MRI.  MRI brain 01/23/2023 was unchanged.     Laboratory tests:: Beta hCG, CBC and BMP were noncontributory 01/12/2021.  Vitamin B12 and TSH were normal 01/07/2021.  REVIEW OF SYSTEMS: Constitutional: No fevers, chills, sweats, or change in appetite Eyes: No visual changes, double vision, eye pain Ear, nose and throat: No hearing loss, ear pain, nasal congestion, sore throat Cardiovascular: No chest pain, palpitations Respiratory:  No shortness of breath at rest or with exertion.   No wheezes GastrointestinaI: No nausea, vomiting, diarrhea, abdominal pain, fecal incontinence Genitourinary:  No dysuria, urinary retention or frequency.  No nocturia. Musculoskeletal:  No neck pain, back pain Integumentary: No rash, pruritus, skin lesions Neurological: as above Psychiatric: No depression at this time.  No anxiety Endocrine: No palpitations, diaphoresis, change in appetite, change in weigh or increased thirst Hematologic/Lymphatic:  No anemia, purpura, petechiae. Allergic/Immunologic: No itchy/runny eyes, nasal congestion, recent allergic reactions, rashes  ALLERGIES: No Known Allergies  HOME MEDICATIONS:  Current Outpatient Medications:    cholecalciferol (VITAMIN D3) 25 MCG (1000 UNIT) tablet, Take 1,000 Units by mouth daily., Disp: , Rfl:    hydrochlorothiazide (MICROZIDE) 12.5 MG capsule, Take 12.5 mg by mouth daily., Disp: , Rfl:    natalizumab  (TYSABRI ) 300 MG/15ML injection, Inject into the vein., Disp: , Rfl:    BLISOVI FE 1/20 1-20 MG-MCG tablet, TAKE 1 TABLET BY MOUTH DAILY (Patient not taking: Reported on 07/02/2024), Disp: 84 tablet, Rfl: 0   doxycycline  (VIBRAMYCIN ) 100 MG capsule, Take 1 capsule (100 mg total) by mouth 2 (two) times daily. (Patient not taking: Reported on 07/02/2024), Disp: 20 capsule, Rfl: 0   propranolol (INDERAL) 40 MG tablet, Take 40 mg by mouth 2 (two) times daily. (Patient not taking:  Reported on 07/02/2024), Disp: , Rfl:    propranolol ER (INDERAL LA) 80 MG 24 hr capsule, Take 80 mg by mouth daily., Disp: , Rfl:    Vitamin D , Ergocalciferol , (DRISDOL ) 1.25 MG (50000 UNIT) CAPS capsule, Take 1 capsule (50,000 Units total) by mouth every 7 (seven) days. (Patient not taking: Reported on 07/02/2024), Disp: 13 capsule, Rfl: 1  PAST MEDICAL HISTORY: Past Medical History:  Diagnosis Date   Hypertension    Irregular periods/menstrual cycles    Multiple sclerosis 01/12/2021    PAST SURGICAL HISTORY: History reviewed. No pertinent surgical history.  FAMILY HISTORY: Family History  Problem Relation Age of Onset   Hypertension Mother    Stroke Mother    Psoriasis Mother    Sleep apnea Mother    Crohn's disease Sister    Cancer Maternal Grandmother        Pancreatic   Diabetes Maternal Aunt    Atrial fibrillation Maternal Aunt    Asthma Maternal Aunt    Migraines Neg Hx  Seizures Neg Hx     SOCIAL HISTORY:  Social History   Socioeconomic History   Marital status: Single    Spouse name: Not on file   Number of children: Not on file   Years of education: Not on file   Highest education level: Some college, no degree  Occupational History   Not on file  Tobacco Use   Smoking status: Never   Smokeless tobacco: Never  Vaping Use   Vaping status: Never Used  Substance and Sexual Activity   Alcohol use: Yes    Comment: Occas   Drug use: No   Sexual activity: Yes    Birth control/protection: None  Other Topics Concern   Not on file  Social History Narrative   Lives w boyfriend   Left handed   Caffeine: 2 cups of coffee in the AM   Social Drivers of Health   Tobacco Use: Low Risk (07/02/2024)   Patient History    Smoking Tobacco Use: Never    Smokeless Tobacco Use: Never    Passive Exposure: Not on file  Financial Resource Strain: Low Risk (12/12/2023)   Received from [facility]   Overall Financial Resource Strain (CARDIA)    How hard is it for you  to pay for the very basics like food, housing, medical care, and heating?: Not very hard  Food Insecurity: No Food Insecurity (12/12/2023)   Received from [facility]   Epic    Within the past 12 months, you worried that your food would run out before you got the money to buy more.: Never true    Within the past 12 months, the food you bought just didn't last and you didn't have money to get more.: Never true  Transportation Needs: No Transportation Needs (12/12/2023)   Received from [facility]    In the past 12 months, has lack of transportation kept you from medical appointments or from getting medications?: No    In the past 12 months, has lack of transportation kept you from meetings, work, or from getting things needed for daily living?: No  Physical Activity: Sufficiently Active (12/12/2023)   Received from [facility]   Exercise Vital Sign    On average, how many days per week do you engage in moderate to strenuous exercise (like a brisk walk)?: 3 days    On average, how many minutes do you engage in exercise at this level?: 120 min  Stress: Stress Concern Present (12/12/2023)   Received from [facility] - Occupational Stress Questionnaire    Do you feel stress - tense, restless, nervous, or anxious, or unable to sleep at night because your mind is troubled all the time - these days?: To some extent  Social Connections: Moderately Integrated (12/12/2023)   Received from [facility]   Social Network    How would you rate your social network (family, work, friends)?: Adequate participation with social networks  Intimate Partner Violence: Not At Risk (12/12/2023)   Received from [facility]   HITS    Over the last 12 months how often did your partner physically hurt you?: Never    Over the last 12 months how often did your partner insult you or talk down to you?: Never    Over the last 12 months how often did your partner  threaten you with physical harm?: Never    Over the last 12 months how often did your partner  scream or curse at you?: Never  Depression (PHQ2-9): Not on file  Alcohol Screen: Not on file  Housing: Low Risk (12/12/2023)   [facility]    In the last 12 months, was there a time when you were not able to pay the mortgage or rent on time?: No    In the past 12 months, how many times have you moved where you were living?: 0    At any time in the past 12 months, were you homeless or living in a shelter (including now)?: No  Utilities: Not At Risk (12/12/2023)   Received from [facility]    In the past 12 months has the electric, gas, oil, or water company threatened to shut off services in your home?: No  Health Literacy: Not on file     PHYSICAL EXAM  Vitals:   07/02/24 1344  BP: 136/88  Pulse: 91  SpO2: 98%  Weight: (!) 328 lb 3.2 oz (148.9 kg)  Height: 5' 10 (1.778 m)   Body mass index is 47.09 kg/m.  No results found.  General: The patient is well-developed and well-nourished and in no acute distress   Neurologic Exam  Mental status: The patient is alert and oriented x 3 at the time of the examination. The patient has apparent normal recent and remote memory, with an apparently normal attention span and concentration ability.   Speech is normal.  Cranial nerves: Extraocular movements are full.  Facial strength and sensation was normal.. No obvious hearing deficits are noted.  Motor:  Muscle bulk is normal.   Tone is normal. Strength is  5 / 5 in all 4 extremities.   Sensory: equal in all tested extremities  Coordination: Cerebellar testing reveals good finger-nose-finger and heel-to-shin bilaterally.  Gait and station: Station is normal.   Gait is normal. Tandem gait is normal. Romberg is negative.   Reflexes: Deep tendon reflexes are symmetric and normal bilaterally.        DIAGNOSTIC DATA (LABS, IMAGING, TESTING) - I reviewed  patient records, labs, notes, testing and imaging myself where available.  Lab Results  Component Value Date   WBC 9.8 12/18/2023   HGB 13.7 12/18/2023   HCT 42.4 12/18/2023   MCV 85 12/18/2023   PLT 329 12/18/2023      Component Value Date/Time   NA 134 (L) 07/20/2023 1246   K 3.4 (L) 07/20/2023 1246   CL 100 07/20/2023 1246   CO2 27 07/20/2023 1246   GLUCOSE 95 07/20/2023 1246   BUN 7 07/20/2023 1246   CREATININE 0.97 07/20/2023 1246   CALCIUM 8.7 (L) 07/20/2023 1246   PROT 6.9 04/06/2021 0942   AST 12 04/06/2021 0942   ALT 14 04/06/2021 0942   BILITOT 0.3 04/06/2021 0942   GFRNONAA >60 07/20/2023 1246   Lab Results  Component Value Date   CHOL 129 06/24/2015   HDL 26 (L) 06/24/2015   LDLCALC 75 06/24/2015   TRIG 140 06/24/2015   CHOLHDL 5.0 06/24/2015   No results found for: HGBA1C No results found for: VITAMINB12 Lab Results  Component Value Date   TSH 1.536 06/24/2015       ASSESSMENT AND PLAN  Multiple sclerosis, primary progressive - Plan: propranolol ER (INDERAL LA) 80 MG 24 hr capsule, CBC with Differential/Platelets, Hepatic Function Panel, MR BRAIN W WO CONTRAST  High risk medication use - Plan: CBC with Differential/Platelets, Hepatic Function Panel  Urinary urgency  Urinary retention  Continue Tysabri .  We will check the JCV antibody today as well as CBC/D and CMP. Repeat MRI of the brain and compare with her images from 12/2022 to determine if there is any subclinical progression.  We will need to consider switching to an anti-CD20 agent if progression is occurring. Stay active and exercise as tolerated. Although she has an empty sella on MRI, she is not reporting much difficulties with headaches and does not have papilledema. Denies interest currently for medications to possibly help with urinary urgency/frequency.  She may consider if symptoms should worsen. Return in 6 months or sooner if there are new or worsening neurologic  symptoms.     I personally spent a total of 25 minutes in the care of the patient today including preparing to see the patient, performing a medically appropriate exam/evaluation, counseling and educating, placing orders, and documenting clinical information in the EHR.  Harlene Bogaert, AGNP-BC  [facility] [address] [place], [place] [address]  Phone [phone] Fax [phone] Note: This document was prepared with digital dictation and possible smart phrase technology. Any transcriptional errors that result from this process are unintentional.  "

## 2024-07-03 ENCOUNTER — Ambulatory Visit: Payer: Self-pay | Admitting: Adult Health

## 2024-07-03 LAB — CBC WITH DIFFERENTIAL/PLATELET
Basophils Absolute: 0.1 10*3/uL (ref 0.0–0.2)
Basos: 1 %
EOS (ABSOLUTE): 0.4 10*3/uL (ref 0.0–0.4)
Eos: 3 %
Hematocrit: 43.7 % (ref 34.0–46.6)
Hemoglobin: 13.7 g/dL (ref 11.1–15.9)
Immature Grans (Abs): 0.1 10*3/uL (ref 0.0–0.1)
Immature Granulocytes: 1 %
Lymphocytes Absolute: 4.6 10*3/uL — ABNORMAL HIGH (ref 0.7–3.1)
Lymphs: 42 %
MCH: 26.6 pg (ref 26.6–33.0)
MCHC: 31.4 g/dL — ABNORMAL LOW (ref 31.5–35.7)
MCV: 85 fL (ref 79–97)
Monocytes Absolute: 0.8 10*3/uL (ref 0.1–0.9)
Monocytes: 7 %
Neutrophils Absolute: 5.1 10*3/uL (ref 1.4–7.0)
Neutrophils: 46 %
Platelets: 405 10*3/uL (ref 150–450)
RBC: 5.15 x10E6/uL (ref 3.77–5.28)
RDW: 13.1 % (ref 11.7–15.4)
WBC: 11 10*3/uL — ABNORMAL HIGH (ref 3.4–10.8)

## 2024-07-03 LAB — STRATIFY JCV(TM) AB W/INDEX

## 2024-07-03 LAB — HEPATIC FUNCTION PANEL
ALT: 21 [IU]/L (ref 0–32)
AST: 22 [IU]/L (ref 0–40)
Albumin: 4.5 g/dL (ref 3.9–4.9)
Alkaline Phosphatase: 67 [IU]/L (ref 41–116)
Bilirubin Total: 0.3 mg/dL (ref 0.0–1.2)
Bilirubin, Direct: 0.08 mg/dL (ref 0.00–0.40)
Total Protein: 7.7 g/dL (ref 6.0–8.5)

## 2024-08-04 ENCOUNTER — Other Ambulatory Visit

## 2025-02-19 ENCOUNTER — Ambulatory Visit: Admitting: Neurology
